# Patient Record
Sex: Female | Born: 1965 | Race: White | Hispanic: No | Marital: Married | Smoking: Never smoker
Health system: Southern US, Community
[De-identification: ages and names within clinical notes are randomized; demographics above are authoritative.]

## PROBLEM LIST (undated history)

## (undated) DIAGNOSIS — M6530 Trigger finger, unspecified finger: Secondary | ICD-10-CM

## (undated) DIAGNOSIS — F329 Major depressive disorder, single episode, unspecified: Secondary | ICD-10-CM

## (undated) DIAGNOSIS — J45909 Unspecified asthma, uncomplicated: Secondary | ICD-10-CM

## (undated) DIAGNOSIS — F419 Anxiety disorder, unspecified: Secondary | ICD-10-CM

## (undated) DIAGNOSIS — F32A Depression, unspecified: Secondary | ICD-10-CM

## (undated) HISTORY — PX: NO PAST SURGERIES: SHX2092

## (undated) HISTORY — DX: Unspecified asthma, uncomplicated: J45.909

---

## 2000-02-28 ENCOUNTER — Other Ambulatory Visit: Admission: RE | Admit: 2000-02-28 | Discharge: 2000-02-28 | Payer: Self-pay | Admitting: Obstetrics and Gynecology

## 2001-05-07 ENCOUNTER — Encounter: Admission: RE | Admit: 2001-05-07 | Discharge: 2001-05-07 | Payer: Self-pay | Admitting: Obstetrics and Gynecology

## 2001-05-07 ENCOUNTER — Encounter: Payer: Self-pay | Admitting: Obstetrics and Gynecology

## 2004-05-30 ENCOUNTER — Encounter: Admission: RE | Admit: 2004-05-30 | Discharge: 2004-05-30 | Payer: Self-pay | Admitting: Obstetrics and Gynecology

## 2004-07-02 ENCOUNTER — Other Ambulatory Visit: Admission: RE | Admit: 2004-07-02 | Discharge: 2004-07-02 | Payer: Self-pay | Admitting: Obstetrics and Gynecology

## 2005-07-03 ENCOUNTER — Other Ambulatory Visit: Admission: RE | Admit: 2005-07-03 | Discharge: 2005-07-03 | Payer: Self-pay | Admitting: Obstetrics and Gynecology

## 2005-08-14 ENCOUNTER — Encounter: Admission: RE | Admit: 2005-08-14 | Discharge: 2005-08-14 | Payer: Self-pay | Admitting: Obstetrics and Gynecology

## 2006-07-17 ENCOUNTER — Other Ambulatory Visit: Admission: RE | Admit: 2006-07-17 | Discharge: 2006-07-17 | Payer: Self-pay | Admitting: Obstetrics and Gynecology

## 2006-09-22 ENCOUNTER — Encounter: Admission: RE | Admit: 2006-09-22 | Discharge: 2006-09-22 | Payer: Self-pay | Admitting: Obstetrics and Gynecology

## 2007-01-19 ENCOUNTER — Ambulatory Visit (HOSPITAL_COMMUNITY): Admission: RE | Admit: 2007-01-19 | Discharge: 2007-01-19 | Payer: Self-pay | Admitting: Obstetrics and Gynecology

## 2007-07-20 ENCOUNTER — Other Ambulatory Visit: Admission: RE | Admit: 2007-07-20 | Discharge: 2007-07-20 | Payer: Self-pay | Admitting: Obstetrics and Gynecology

## 2007-09-24 ENCOUNTER — Encounter: Admission: RE | Admit: 2007-09-24 | Discharge: 2007-09-24 | Payer: Self-pay | Admitting: Obstetrics and Gynecology

## 2008-07-24 ENCOUNTER — Other Ambulatory Visit: Admission: RE | Admit: 2008-07-24 | Discharge: 2008-07-24 | Payer: Self-pay | Admitting: Obstetrics and Gynecology

## 2008-10-02 ENCOUNTER — Encounter: Admission: RE | Admit: 2008-10-02 | Discharge: 2008-10-02 | Payer: Self-pay | Admitting: Obstetrics and Gynecology

## 2009-06-12 ENCOUNTER — Ambulatory Visit: Payer: Self-pay | Admitting: Cardiology

## 2009-07-20 ENCOUNTER — Encounter: Admission: RE | Admit: 2009-07-20 | Discharge: 2009-07-20 | Payer: Self-pay | Admitting: Obstetrics and Gynecology

## 2009-08-09 ENCOUNTER — Other Ambulatory Visit: Admission: RE | Admit: 2009-08-09 | Discharge: 2009-08-09 | Payer: Self-pay | Admitting: Obstetrics and Gynecology

## 2010-08-22 ENCOUNTER — Other Ambulatory Visit: Admission: RE | Admit: 2010-08-22 | Discharge: 2010-08-22 | Payer: Self-pay | Admitting: Obstetrics and Gynecology

## 2010-08-22 ENCOUNTER — Other Ambulatory Visit
Admission: RE | Admit: 2010-08-22 | Discharge: 2010-08-22 | Payer: Self-pay | Source: Home / Self Care | Admitting: Obstetrics and Gynecology

## 2010-10-10 ENCOUNTER — Encounter: Admission: RE | Admit: 2010-10-10 | Discharge: 2010-10-10 | Payer: Self-pay | Admitting: Obstetrics and Gynecology

## 2011-09-04 ENCOUNTER — Other Ambulatory Visit: Payer: Self-pay | Admitting: Nurse Practitioner

## 2011-09-04 ENCOUNTER — Other Ambulatory Visit (HOSPITAL_COMMUNITY)
Admission: RE | Admit: 2011-09-04 | Discharge: 2011-09-04 | Disposition: A | Discharge status: Home / Self Care | Payer: 59 | Source: Ambulatory Visit | Attending: Obstetrics and Gynecology | Admitting: Obstetrics and Gynecology

## 2011-09-04 DIAGNOSIS — Z1159 Encounter for screening for other viral diseases: Secondary | ICD-10-CM | POA: Insufficient documentation

## 2011-09-04 DIAGNOSIS — Z01419 Encounter for gynecological examination (general) (routine) without abnormal findings: Secondary | ICD-10-CM | POA: Insufficient documentation

## 2011-10-14 ENCOUNTER — Other Ambulatory Visit: Payer: Self-pay | Admitting: Obstetrics and Gynecology

## 2011-10-14 DIAGNOSIS — Z1231 Encounter for screening mammogram for malignant neoplasm of breast: Secondary | ICD-10-CM

## 2011-11-10 ENCOUNTER — Ambulatory Visit: Payer: 59

## 2012-02-12 ENCOUNTER — Ambulatory Visit
Admission: RE | Admit: 2012-02-12 | Discharge: 2012-02-12 | Disposition: A | Discharge status: Home / Self Care | Payer: 59 | Source: Ambulatory Visit | Attending: Obstetrics and Gynecology | Admitting: Obstetrics and Gynecology

## 2012-02-12 DIAGNOSIS — Z1231 Encounter for screening mammogram for malignant neoplasm of breast: Secondary | ICD-10-CM

## 2013-05-30 ENCOUNTER — Other Ambulatory Visit: Payer: Self-pay

## 2013-05-30 DIAGNOSIS — Z1231 Encounter for screening mammogram for malignant neoplasm of breast: Secondary | ICD-10-CM

## 2013-06-16 ENCOUNTER — Ambulatory Visit
Admission: RE | Admit: 2013-06-16 | Discharge: 2013-06-16 | Disposition: A | Discharge status: Home / Self Care | Payer: BC Managed Care – PPO | Source: Ambulatory Visit

## 2013-06-16 DIAGNOSIS — Z1231 Encounter for screening mammogram for malignant neoplasm of breast: Secondary | ICD-10-CM

## 2013-09-08 ENCOUNTER — Other Ambulatory Visit (HOSPITAL_COMMUNITY)
Admission: RE | Admit: 2013-09-08 | Discharge: 2013-09-08 | Disposition: A | Discharge status: Home / Self Care | Payer: BC Managed Care – PPO | Source: Ambulatory Visit | Attending: Nurse Practitioner | Admitting: Nurse Practitioner

## 2013-09-08 ENCOUNTER — Other Ambulatory Visit: Payer: Self-pay | Admitting: Nurse Practitioner

## 2013-09-08 DIAGNOSIS — Z01419 Encounter for gynecological examination (general) (routine) without abnormal findings: Secondary | ICD-10-CM | POA: Insufficient documentation

## 2013-09-08 DIAGNOSIS — Z1151 Encounter for screening for human papillomavirus (HPV): Secondary | ICD-10-CM | POA: Insufficient documentation

## 2014-07-11 ENCOUNTER — Other Ambulatory Visit: Payer: Self-pay

## 2014-07-11 DIAGNOSIS — Z1231 Encounter for screening mammogram for malignant neoplasm of breast: Secondary | ICD-10-CM

## 2014-07-27 ENCOUNTER — Ambulatory Visit
Admission: RE | Admit: 2014-07-27 | Discharge: 2014-07-27 | Disposition: A | Discharge status: Home / Self Care | Payer: BC Managed Care – PPO | Source: Ambulatory Visit

## 2014-07-27 DIAGNOSIS — Z1231 Encounter for screening mammogram for malignant neoplasm of breast: Secondary | ICD-10-CM

## 2015-08-21 ENCOUNTER — Other Ambulatory Visit: Payer: Self-pay

## 2015-08-21 DIAGNOSIS — Z1231 Encounter for screening mammogram for malignant neoplasm of breast: Secondary | ICD-10-CM

## 2015-09-14 ENCOUNTER — Ambulatory Visit
Admission: RE | Admit: 2015-09-14 | Discharge: 2015-09-14 | Disposition: A | Discharge status: Home / Self Care | Payer: No Typology Code available for payment source | Source: Ambulatory Visit

## 2015-09-14 DIAGNOSIS — Z1231 Encounter for screening mammogram for malignant neoplasm of breast: Secondary | ICD-10-CM

## 2016-01-15 DIAGNOSIS — M65311 Trigger thumb, right thumb: Secondary | ICD-10-CM | POA: Insufficient documentation

## 2016-04-08 ENCOUNTER — Encounter (HOSPITAL_BASED_OUTPATIENT_CLINIC_OR_DEPARTMENT_OTHER): Payer: Self-pay | Admitting: *Deleted

## 2016-04-09 ENCOUNTER — Other Ambulatory Visit: Payer: Self-pay | Admitting: Orthopedic Surgery

## 2016-04-14 ENCOUNTER — Encounter (HOSPITAL_BASED_OUTPATIENT_CLINIC_OR_DEPARTMENT_OTHER)
Admission: RE | Disposition: A | Discharge status: Home / Self Care | Payer: Self-pay | Source: Ambulatory Visit | Attending: Orthopedic Surgery

## 2016-04-14 ENCOUNTER — Ambulatory Visit (HOSPITAL_BASED_OUTPATIENT_CLINIC_OR_DEPARTMENT_OTHER)
Admission: RE | Admit: 2016-04-14 | Discharge: 2016-04-14 | Disposition: A | Discharge status: Home / Self Care | Payer: Self-pay | Source: Ambulatory Visit | Attending: Orthopedic Surgery | Admitting: Orthopedic Surgery

## 2016-04-14 ENCOUNTER — Encounter (HOSPITAL_BASED_OUTPATIENT_CLINIC_OR_DEPARTMENT_OTHER): Payer: Self-pay | Admitting: *Deleted

## 2016-04-14 ENCOUNTER — Ambulatory Visit (HOSPITAL_BASED_OUTPATIENT_CLINIC_OR_DEPARTMENT_OTHER): Payer: Self-pay | Admitting: Anesthesiology

## 2016-04-14 DIAGNOSIS — F419 Anxiety disorder, unspecified: Secondary | ICD-10-CM | POA: Insufficient documentation

## 2016-04-14 DIAGNOSIS — M65311 Trigger thumb, right thumb: Secondary | ICD-10-CM | POA: Insufficient documentation

## 2016-04-14 HISTORY — DX: Trigger finger, unspecified finger: M65.30

## 2016-04-14 HISTORY — PX: TRIGGER FINGER RELEASE: SHX641

## 2016-04-14 HISTORY — DX: Depression, unspecified: F32.A

## 2016-04-14 HISTORY — DX: Anxiety disorder, unspecified: F41.9

## 2016-04-14 HISTORY — DX: Major depressive disorder, single episode, unspecified: F32.9

## 2016-04-14 SURGERY — RELEASE, A1 PULLEY, FOR TRIGGER FINGER
Anesthesia: Monitor Anesthesia Care | Site: Thumb | Laterality: Right

## 2016-04-14 MED ORDER — PROPOFOL 500 MG/50ML IV EMUL
INTRAVENOUS | Status: DC | PRN
  Administered 2016-04-14: 75 ug/kg/min via INTRAVENOUS

## 2016-04-14 MED ORDER — METOCLOPRAMIDE HCL 5 MG/ML IJ SOLN: 10.0000 mg | Freq: Once | INTRAMUSCULAR | Status: DC | PRN

## 2016-04-14 MED ORDER — FENTANYL CITRATE (PF) 100 MCG/2ML IJ SOLN
50.0000 ug | INTRAMUSCULAR | Status: DC | PRN
  Administered 2016-04-14: 50 ug via INTRAVENOUS
  Administered 2016-04-14: 25 ug via INTRAVENOUS

## 2016-04-14 MED ORDER — CEFAZOLIN SODIUM-DEXTROSE 2-4 GM/100ML-% IV SOLN
INTRAVENOUS | Status: AC
  Filled 2016-04-14: qty 100

## 2016-04-14 MED ORDER — LACTATED RINGERS IV SOLN
INTRAVENOUS | Status: DC
  Administered 2016-04-14: 13:00:00 via INTRAVENOUS

## 2016-04-14 MED ORDER — ONDANSETRON HCL 4 MG/2ML IJ SOLN
INTRAMUSCULAR | Status: DC | PRN
  Administered 2016-04-14: 4 mg via INTRAVENOUS

## 2016-04-14 MED ORDER — BUPIVACAINE HCL (PF) 0.25 % IJ SOLN
INTRAMUSCULAR | Status: DC | PRN
  Administered 2016-04-14: 5 mL

## 2016-04-14 MED ORDER — GLYCOPYRROLATE 0.2 MG/ML IJ SOLN
0.2000 mg | Freq: Once | INTRAMUSCULAR | Status: DC | PRN
Start: 2016-04-14 — End: 2016-04-14

## 2016-04-14 MED ORDER — MIDAZOLAM HCL 2 MG/2ML IJ SOLN
INTRAMUSCULAR | Status: AC
  Filled 2016-04-14: qty 2

## 2016-04-14 MED ORDER — MEPERIDINE HCL 25 MG/ML IJ SOLN: 6.2500 mg | INTRAMUSCULAR | Status: DC | PRN

## 2016-04-14 MED ORDER — FENTANYL CITRATE (PF) 100 MCG/2ML IJ SOLN
INTRAMUSCULAR | Status: AC
  Filled 2016-04-14: qty 2

## 2016-04-14 MED ORDER — PROPOFOL 500 MG/50ML IV EMUL
INTRAVENOUS | Status: AC
  Filled 2016-04-14: qty 50

## 2016-04-14 MED ORDER — SCOPOLAMINE 1 MG/3DAYS TD PT72: 1.0000 | MEDICATED_PATCH | Freq: Once | TRANSDERMAL | Status: DC | PRN

## 2016-04-14 MED ORDER — FENTANYL CITRATE (PF) 100 MCG/2ML IJ SOLN: 25.0000 ug | INTRAMUSCULAR | Status: DC | PRN

## 2016-04-14 MED ORDER — CEFAZOLIN SODIUM-DEXTROSE 2-4 GM/100ML-% IV SOLN
2.0000 g | INTRAVENOUS | Status: AC
  Administered 2016-04-14: 2 g via INTRAVENOUS

## 2016-04-14 MED ORDER — LACTATED RINGERS IV SOLN: INTRAVENOUS | Status: DC

## 2016-04-14 MED ORDER — CHLORHEXIDINE GLUCONATE 4 % EX LIQD: 60.0000 mL | Freq: Once | CUTANEOUS | Status: DC

## 2016-04-14 MED ORDER — ONDANSETRON HCL 4 MG/2ML IJ SOLN
INTRAMUSCULAR | Status: AC
  Filled 2016-04-14: qty 2

## 2016-04-14 MED ORDER — BUPIVACAINE HCL (PF) 0.25 % IJ SOLN
INTRAMUSCULAR | Status: AC
  Filled 2016-04-14: qty 30

## 2016-04-14 MED ORDER — MIDAZOLAM HCL 2 MG/2ML IJ SOLN
1.0000 mg | INTRAMUSCULAR | Status: DC | PRN
  Administered 2016-04-14: 0.5 mg via INTRAVENOUS
  Administered 2016-04-14: 1 mg via INTRAVENOUS

## 2016-04-14 MED ORDER — HYDROCODONE-ACETAMINOPHEN 5-325 MG PO TABS: ORAL_TABLET | ORAL | Status: DC

## 2016-04-14 SURGICAL SUPPLY — 36 items
BANDAGE COBAN STERILE 2 (GAUZE/BANDAGES/DRESSINGS) IMPLANT
BLADE MINI RND TIP GREEN BEAV (BLADE) IMPLANT
BLADE SURG 15 STRL LF DISP TIS (BLADE) ×2 IMPLANT
BLADE SURG 15 STRL SS (BLADE) ×6
BNDG CMPR 9X4 STRL LF SNTH (GAUZE/BANDAGES/DRESSINGS)
BNDG COHESIVE 3X5 TAN STRL LF (GAUZE/BANDAGES/DRESSINGS) ×3 IMPLANT
BNDG CONFORM 2 STRL LF (GAUZE/BANDAGES/DRESSINGS) ×3 IMPLANT
BNDG ESMARK 4X9 LF (GAUZE/BANDAGES/DRESSINGS) IMPLANT
CHLORAPREP W/TINT 26ML (MISCELLANEOUS) ×3 IMPLANT
CORDS BIPOLAR (ELECTRODE) ×3 IMPLANT
COVER BACK TABLE 60X90IN (DRAPES) ×3 IMPLANT
COVER MAYO STAND STRL (DRAPES) ×3 IMPLANT
CUFF TOURNIQUET SINGLE 18IN (TOURNIQUET CUFF) ×3 IMPLANT
DRAPE EXTREMITY T 121X128X90 (DRAPE) ×3 IMPLANT
DRAPE SURG 17X23 STRL (DRAPES) ×3 IMPLANT
DRESSING ADAPTIC 1/2  N-ADH (PACKING) ×3 IMPLANT
GAUZE SPONGE 4X4 12PLY STRL (GAUZE/BANDAGES/DRESSINGS) ×3 IMPLANT
GAUZE XEROFORM 1X8 LF (GAUZE/BANDAGES/DRESSINGS) ×3 IMPLANT
GLOVE BIO SURGEON STRL SZ7.5 (GLOVE) ×3 IMPLANT
GLOVE BIOGEL PI IND STRL 8 (GLOVE) ×1 IMPLANT
GLOVE BIOGEL PI INDICATOR 8 (GLOVE) ×2
GOWN STRL REIN XL XLG (GOWN DISPOSABLE) ×3 IMPLANT
GOWN STRL REUS W/ TWL LRG LVL3 (GOWN DISPOSABLE) ×1 IMPLANT
GOWN STRL REUS W/TWL LRG LVL3 (GOWN DISPOSABLE) ×2
NEEDLE HYPO 25X1 1.5 SAFETY (NEEDLE) IMPLANT
NS IRRIG 1000ML POUR BTL (IV SOLUTION) ×3 IMPLANT
PACK BASIN DAY SURGERY FS (CUSTOM PROCEDURE TRAY) ×3 IMPLANT
PADDING CAST ABS 4INX4YD NS (CAST SUPPLIES) ×2
PADDING CAST ABS COTTON 4X4 ST (CAST SUPPLIES) ×1 IMPLANT
SPONGE GAUZE 4X4 12PLY STER LF (GAUZE/BANDAGES/DRESSINGS) ×3 IMPLANT
STOCKINETTE 4X48 STRL (DRAPES) ×3 IMPLANT
SUT ETHILON 4 0 PS 2 18 (SUTURE) ×3 IMPLANT
SYR BULB 3OZ (MISCELLANEOUS) ×3 IMPLANT
SYR CONTROL 10ML LL (SYRINGE) IMPLANT
TOWEL OR 17X24 6PK STRL BLUE (TOWEL DISPOSABLE) ×3 IMPLANT
UNDERPAD 30X30 (UNDERPADS AND DIAPERS) ×3 IMPLANT

## 2016-04-14 NOTE — Anesthesia Postprocedure Evaluation (Signed)
Anesthesia Post Note  Patient: Alexa Cohen  Procedure(s) Performed: Procedure(s) (LRB): RIGHT THUMB TRIGGER RELEASE  (Right)  Patient location during evaluation: PACU Anesthesia Type: Bier Block Level of consciousness: awake and alert Pain management: pain level controlled Vital Signs Assessment: post-procedure vital signs reviewed and stable Respiratory status: spontaneous breathing, nonlabored ventilation, respiratory function stable and patient connected to nasal cannula oxygen Cardiovascular status: blood pressure returned to baseline and stable Postop Assessment: no signs of nausea or vomiting Anesthetic complications: no    Last Vitals:  Filed Vitals:   04/14/16 1445 04/14/16 1515  BP: 106/76 109/75  Pulse: 55 56  Temp:  36.4 C  Resp: 13 18    Last Pain:  Filed Vitals:   04/14/16 1516  PainSc: 0-No pain                 Phillips Groutarignan, [name]

## 2016-04-14 NOTE — Discharge Instructions (Addendum)

## 2016-04-14 NOTE — H&P (Signed)
  Alexa Cohen is an 50 y.o. female.   Chief Complaint: right thumb trigger digit HPI: 50 yo female with triggering of right thumb.  This has been injected twice without resolution.  She wishes to have a trigger release for management of her symptoms.  Allergies: No Known Allergies  Past Medical History  Diagnosis Date  . Depression   . Anxiety   . Trigger finger of right hand     thumb    Past Surgical History  Procedure Laterality Date  . No past surgeries      Family History: History reviewed. No pertinent family history.  Social History:   reports that she has never smoked. She does not have any smokeless tobacco history on file. She reports that she drinks alcohol. She reports that she does not use illicit drugs.  Medications: Medications Prior to Admission  Medication Sig Dispense Refill  . citalopram (CELEXA) 10 MG tablet Take 30 mg by mouth daily.      No results found for this or any previous visit (from the past 48 hour(s)).  No results found.   A comprehensive review of systems was negative.  Blood pressure 106/66, pulse 59, temperature 98 F (36.7 C), temperature source Oral, resp. rate 18, height 5\' 3"  (1.6 m), weight 66.86 kg (147 lb 6.4 oz), last menstrual period 04/08/2016, SpO2 98 %.  General appearance: alert, cooperative and appears stated age Head: Normocephalic, without obvious abnormality, atraumatic Neck: supple, symmetrical, trachea midline Resp: clear to auscultation bilaterally Cardio: regular rate and rhythm GI: non-tender Extremities: Intact sensation and capillary refill all digits.  +epl/fpl/io.  No wounds.  Pulses: 2+ and symmetric Skin: Skin color, texture, turgor normal. No rashes or lesions Neurologic: Grossly normal Incision/Wound:none  Assessment/Plan Right thumb trigger digit.  Non operative and operative treatment options were discussed with the patient and patient wishes to proceed with operative treatment. Risks,  benefits, and alternatives of surgery were discussed and the patient agrees with the plan of care.   Orie Baxendale R 04/14/2016, 1:45 PM

## 2016-04-14 NOTE — Brief Op Note (Signed)
04/14/2016  2:19 PM  PATIENT:  Lakita P Vacha  50 y.o. female  PRE-OPERATIVE DIAGNOSIS:  RIGHT THUMB TRIGGER DIGIT   POST-OPERATIVE DIAGNOSIS:  RIGHT THUMB TRIGGER DIGIT   PROCEDURE:  Procedure(s) with comments: RIGHT THUMB TRIGGER RELEASE  (Right) - RIGHT THUMB TRIGGER RELEASE   SURGEON:  Surgeon(s) and Role:    * Betha LoaKevin Maddox Bratcher, MD - Primary  PHYSICIAN ASSISTANT:   ASSISTANTS: none   ANESTHESIA:   Bier block with sedation  EBL:     BLOOD ADMINISTERED:none  DRAINS: none   LOCAL MEDICATIONS USED:  MARCAINE     SPECIMEN:  No Specimen  DISPOSITION OF SPECIMEN:  N/A  COUNTS:  YES  TOURNIQUET:   Total Tourniquet Time Documented: Forearm (Right) - 18 minutes Total: Forearm (Right) - 18 minutes   DICTATION: .Other Dictation: Dictation Number [id number]  PLAN OF CARE: Discharge to home after PACU  PATIENT DISPOSITION:  PACU - hemodynamically stable.

## 2016-04-14 NOTE — Anesthesia Preprocedure Evaluation (Signed)
Anesthesia Evaluation  Patient identified by MRN, date of birth, ID band Patient awake    Reviewed: Allergy & Precautions, NPO status , Patient's Chart, lab work & pertinent test results  Airway Mallampati: II  TM Distance: >3 FB Neck ROM: Full    Dental no notable dental hx.    Pulmonary neg pulmonary ROS,    Pulmonary exam normal breath sounds clear to auscultation       Cardiovascular negative cardio ROS Normal cardiovascular exam Rhythm:Regular Rate:Normal     Neuro/Psych negative neurological ROS  negative psych ROS   GI/Hepatic negative GI ROS, Neg liver ROS,   Endo/Other  negative endocrine ROS  Renal/GU negative Renal ROS  negative genitourinary   Musculoskeletal negative musculoskeletal ROS (+)   Abdominal   Peds negative pediatric ROS (+)  Hematology negative hematology ROS (+)   Anesthesia Other Findings   Reproductive/Obstetrics negative OB ROS                             Anesthesia Physical Anesthesia Plan  ASA: II  Anesthesia Plan: Bier Block and MAC   Post-op Pain Management:    Induction: Intravenous  Airway Management Planned: Natural Airway  Additional Equipment:   Intra-op Plan:   Post-operative Plan:   Informed Consent: I have reviewed the patients History and Physical, chart, labs and discussed the procedure including the risks, benefits and alternatives for the proposed anesthesia with the patient or authorized representative who has indicated his/her understanding and acceptance.   Dental advisory given  Plan Discussed with: CRNA  Anesthesia Plan Comments:         Anesthesia Quick Evaluation

## 2016-04-14 NOTE — Transfer of Care (Signed)
Immediate Anesthesia Transfer of Care Note  Patient: Alexa Cohen  Procedure(s) Performed: Procedure(s) with comments: RIGHT THUMB TRIGGER RELEASE  (Right) - RIGHT THUMB TRIGGER RELEASE   Patient Location: PACU  Anesthesia Type:Bier block  Level of Consciousness: awake, alert  and oriented  Airway & Oxygen Therapy: Patient Spontanous Breathing and Patient connected to face mask oxygen  Post-op Assessment: Report given to RN and Post -op Vital signs reviewed and stable  Post vital signs: Reviewed and stable  Last Vitals:  Filed Vitals:   04/14/16 1445 04/14/16 1515  BP: 106/76 109/75  Pulse: 55 56  Temp:  36.4 C  Resp: 13 18    Last Pain:  Filed Vitals:   04/14/16 1516  PainSc: 0-No pain         Complications: No apparent anesthesia complications

## 2016-04-14 NOTE — Op Note (Signed)
[id number] 

## 2016-04-15 ENCOUNTER — Encounter (HOSPITAL_BASED_OUTPATIENT_CLINIC_OR_DEPARTMENT_OTHER): Payer: Self-pay | Admitting: Orthopedic Surgery

## 2016-04-15 NOTE — Op Note (Signed)
Alexa Cohen, Alexa Cohen               ACCOUNT NO.:  [id number]  MEDICAL RECORD NO.:  [id number]  LOCATION:                                 FACILITY:  PHYSICIAN:  Betha Loa, MD             DATE OF BIRTH:  DATE OF PROCEDURE:  04/14/2016 DATE OF DISCHARGE:                              OPERATIVE REPORT   PREOPERATIVE DIAGNOSIS:  Right thumb trigger digit.  POSTOPERATIVE DIAGNOSIS:  Right thumb trigger digit.  PROCEDURE:  Right thumb trigger release.  SURGEON:  Betha Loa, MD  ASSISTANT:  None.  ANESTHESIA:  Bier block with sedation.  IV FLUIDS:  Per anesthesia flow sheet.  ESTIMATED BLOOD LOSS:  Minimal.  COMPLICATIONS:  None.  SPECIMENS:  None.  TOURNIQUET TIME:  18 minutes.  DISPOSITION:  Stable to PACU.  INDICATIONS:  Alexa Cohen is a 50 year old female who has had triggering of the right thumb.  This has been injected twice without resolution. She wished to have a trigger release for management of the symptoms. Risks, benefits and alternatives of the surgery were discussed including the risk of blood loss; infection; damage to nerves, vessels, tendons, ligaments, bone; failure of surgery; need for additional surgery; complications with wound healing; continued pain and recurrence of triggering.  She voiced understanding of these risks and elected to proceed.  OPERATIVE COURSE:  After being identified preoperatively by myself, the patient and I agreed upon the procedure and site of procedure.  Surgical site was marked.  Risks, benefits, and alternatives of the surgery were reviewed and she wished to proceed.  Surgical consent had been signed. She was given IV Ancef as preoperative antibiotic prophylaxis.  She was transferred to the operating room and placed on the operating room table in supine position with the right upper extremity on an armboard.  Bier block anesthesia was induced by anesthesiologist.  The right upper extremity was prepped and draped in normal  sterile orthopedic fashion. A surgical pause was performed between the surgeons, anesthesia, and operating room staff, and all were in agreement as to the patient, procedure and site of procedure.  Tourniquet at the proximal aspect of the forearm had been inflated for the Bier block.  Incision was made at the proximal flexion crease of the thumb through the skin only.  This was carried into subcutaneous tissues by spreading technique.  The radial and ulnar digital nerves were identified and protected throughout the case.  The A1 pulley was identified and sharply incised.  This was incised with knife and scissors.  The tendon was brought through the wound and no triggering was noted.  The wound was irrigated with sterile saline.  It was closed with 4-0 nylon in a horizontal mattress fashion. It was injected with 5 mL of 0.25% plain Marcaine to aid in postoperative analgesia.  It was then dressed with sterile Xeroform, 4x4s, and wrapped with a Kling and Coban dressing lightly.  Tourniquet was deflated at 18 minutes.  The fingertips were pink with brisk capillary refill after the deflation of the tourniquet.  Operative drapes were broken down.  The patient was awoken from anesthesia safely. She was transferred back to  the stretcher and taken to PACU in stable condition.  I will see her back in the office in 1 week for postoperative followup.  I will give her Norco 5/325, 1-2 p.o. q.6 hours p.r.n. pain, dispensed #15.     Betha LoaKevin Diyan Dave, MD     KK/MEDQ  D:  04/14/2016  T:  04/15/2016  Job:  [id number]

## 2016-09-11 ENCOUNTER — Other Ambulatory Visit: Payer: Self-pay | Admitting: Nurse Practitioner

## 2016-09-11 ENCOUNTER — Other Ambulatory Visit (HOSPITAL_COMMUNITY)
Admission: RE | Admit: 2016-09-11 | Discharge: 2016-09-11 | Disposition: A | Discharge status: Home / Self Care | Payer: No Typology Code available for payment source | Source: Ambulatory Visit | Attending: Nurse Practitioner | Admitting: Nurse Practitioner

## 2016-09-11 DIAGNOSIS — Z01419 Encounter for gynecological examination (general) (routine) without abnormal findings: Secondary | ICD-10-CM | POA: Insufficient documentation

## 2016-09-11 DIAGNOSIS — Z1151 Encounter for screening for human papillomavirus (HPV): Secondary | ICD-10-CM | POA: Insufficient documentation

## 2016-09-15 LAB — CYTOLOGY - PAP
Diagnosis: NEGATIVE
HPV (WINDOPATH): NOT DETECTED

## 2016-09-19 ENCOUNTER — Other Ambulatory Visit: Payer: Self-pay | Admitting: Nurse Practitioner

## 2016-09-19 DIAGNOSIS — Z1231 Encounter for screening mammogram for malignant neoplasm of breast: Secondary | ICD-10-CM

## 2016-10-27 ENCOUNTER — Ambulatory Visit
Admission: RE | Admit: 2016-10-27 | Discharge: 2016-10-27 | Disposition: A | Discharge status: Home / Self Care | Payer: No Typology Code available for payment source | Source: Ambulatory Visit | Attending: Nurse Practitioner | Admitting: Nurse Practitioner

## 2016-10-27 DIAGNOSIS — Z1231 Encounter for screening mammogram for malignant neoplasm of breast: Secondary | ICD-10-CM

## 2017-12-17 ENCOUNTER — Other Ambulatory Visit: Payer: Self-pay | Admitting: Nurse Practitioner

## 2017-12-17 DIAGNOSIS — Z139 Encounter for screening, unspecified: Secondary | ICD-10-CM

## 2018-01-07 ENCOUNTER — Ambulatory Visit
Admission: RE | Admit: 2018-01-07 | Discharge: 2018-01-07 | Disposition: A | Discharge status: Home / Self Care | Payer: No Typology Code available for payment source | Source: Ambulatory Visit | Attending: Nurse Practitioner | Admitting: Nurse Practitioner

## 2018-01-07 DIAGNOSIS — Z139 Encounter for screening, unspecified: Secondary | ICD-10-CM

## 2018-10-14 ENCOUNTER — Ambulatory Visit: Payer: Self-pay | Admitting: Psychiatry

## 2018-10-20 ENCOUNTER — Encounter: Payer: Self-pay | Admitting: Emergency Medicine

## 2018-10-20 DIAGNOSIS — F419 Anxiety disorder, unspecified: Secondary | ICD-10-CM

## 2018-10-20 DIAGNOSIS — F331 Major depressive disorder, recurrent, moderate: Secondary | ICD-10-CM | POA: Insufficient documentation

## 2018-10-28 ENCOUNTER — Encounter: Payer: Self-pay | Admitting: Physician Assistant

## 2018-10-28 ENCOUNTER — Ambulatory Visit: Payer: Self-pay | Admitting: Physician Assistant

## 2018-10-28 DIAGNOSIS — F331 Major depressive disorder, recurrent, moderate: Secondary | ICD-10-CM

## 2018-10-28 DIAGNOSIS — F411 Generalized anxiety disorder: Secondary | ICD-10-CM

## 2018-10-28 MED ORDER — BUPROPION HCL ER (XL) 300 MG PO TB24: 300.0000 mg | ORAL_TABLET | ORAL | 1 refills | Status: DC

## 2018-10-28 MED ORDER — CITALOPRAM HYDROBROMIDE 20 MG PO TABS: 30.0000 mg | ORAL_TABLET | Freq: Every day | ORAL | 1 refills | Status: DC

## 2018-10-28 NOTE — Progress Notes (Signed)
[facility] Med Check  Patient ID: Tonny BollmanCarmela P Karpowicz,  MRN: [id number]  PCP: Estanislado PandySasser, Paul W, MD  Date of Evaluation: 10/28/2018 Time spent:15 minutes  Chief Complaint:  Chief Complaint    Follow-up      HISTORY/CURRENT STATUS: HPI Here for routine 6 month med check.  Patient denies loss of interest in usual activities and is able to enjoy things.  Denies decreased energy or motivation.  Appetite has not changed.  No extreme sadness, tearfulness, or feelings of hopelessness.  Denies any changes in concentration, making decisions or remembering things.  Denies suicidal or homicidal thoughts.  Anxiety is well controlled.  Sleeps well for the most part.   Individual Medical History/ Review of Systems: Changes? :No   Allergies: Patient has no known allergies.  Current Medications:  Current Outpatient Medications:  .  buPROPion (WELLBUTRIN XL) 300 MG 24 hr tablet, Take 300 mg by mouth every morning., Disp: , Rfl:  .  citalopram (CELEXA) 20 MG tablet, Take 30 mg by mouth daily., Disp: , Rfl:  .  LORazepam (ATIVAN) 0.5 MG tablet, Take 0.5 mg by mouth 3 (three) times daily as needed for anxiety., Disp: , Rfl:  Medication Side Effects: none  Family Medical/ Social History: Changes? No  MENTAL HEALTH EXAM:  There were no vitals taken for this visit.There is no height or weight on file to calculate BMI.  General Appearance: Casual and Well Groomed  Eye Contact:  Good  Speech:  Clear and Coherent  Volume:  Normal  Mood:  Euthymic  Affect:  Appropriate  Thought Process:  Goal Directed  Orientation:  Full (Time, Place, and Person)  Thought Content: Logical   Suicidal Thoughts:  No  Homicidal Thoughts:  No  Memory:  WNL  Judgement:  Good  Insight:  Good  Psychomotor Activity:  Normal  Concentration:  Concentration: Good  Recall:  Good  Fund of Knowledge: Good  Language: Good  Assets:  Desire for Improvement  ADL's:  Intact  Cognition: WNL  Prognosis:  Good     DIAGNOSES:    ICD-10-CM   1. Major depressive disorder, recurrent episode, moderate (HCC) F33.1   2. Generalized anxiety disorder F41.1     Receiving Psychotherapy: No was seeing Fred May, LPC but going prn now.  RECOMMENDATIONS: cont all current meds.  She asked about weaning off meds if possible.  Winter is her most difficult time of the year.  Therefore she will wait to decrease Celexa to 20 mg in the late spring, if she decides to do it.  She may not want to do that and either way is fine with me. If she needs Ativan refill before the next visit she can call. Continue therapy with Merlyn AlbertFred may LPC. Return in 6 months or sooner as needed.   Melony Overlyeresa Carmel Waddington, PA-C

## 2018-12-16 ENCOUNTER — Ambulatory Visit: Payer: Self-pay | Admitting: Psychiatry

## 2019-03-01 ENCOUNTER — Ambulatory Visit: Payer: Self-pay | Admitting: Psychiatry

## 2019-04-28 ENCOUNTER — Ambulatory Visit: Payer: Self-pay | Admitting: Physician Assistant

## 2019-06-06 ENCOUNTER — Other Ambulatory Visit: Payer: Self-pay | Admitting: Nurse Practitioner

## 2019-06-06 DIAGNOSIS — Z1231 Encounter for screening mammogram for malignant neoplasm of breast: Secondary | ICD-10-CM

## 2019-07-05 ENCOUNTER — Ambulatory Visit: Payer: Self-pay | Admitting: Physician Assistant

## 2019-07-05 ENCOUNTER — Other Ambulatory Visit: Payer: Self-pay

## 2019-07-05 ENCOUNTER — Telehealth: Payer: Self-pay | Admitting: Physician Assistant

## 2019-07-05 MED ORDER — CITALOPRAM HYDROBROMIDE 20 MG PO TABS: 30.0000 mg | ORAL_TABLET | Freq: Every day | ORAL | 0 refills | Status: DC

## 2019-07-05 MED ORDER — BUPROPION HCL ER (XL) 300 MG PO TB24: 300.0000 mg | ORAL_TABLET | ORAL | 0 refills | Status: DC

## 2019-07-05 NOTE — Telephone Encounter (Signed)
Refills sent

## 2019-07-05 NOTE — Telephone Encounter (Signed)
Pt request refill for Wellbutrin and Citalopram 90 day @ [facility] in [place] on file. Had appt today provider sick. We rescheduled.

## 2019-08-09 ENCOUNTER — Ambulatory Visit
Admission: RE | Admit: 2019-08-09 | Discharge: 2019-08-09 | Disposition: A | Discharge status: Home / Self Care | Payer: BC Managed Care – PPO | Source: Ambulatory Visit | Attending: Nurse Practitioner | Admitting: Nurse Practitioner

## 2019-08-09 ENCOUNTER — Other Ambulatory Visit: Payer: Self-pay

## 2019-08-09 ENCOUNTER — Encounter: Payer: Self-pay | Admitting: Psychiatry

## 2019-08-09 ENCOUNTER — Ambulatory Visit: Payer: Self-pay | Admitting: Physician Assistant

## 2019-08-09 ENCOUNTER — Ambulatory Visit (INDEPENDENT_AMBULATORY_CARE_PROVIDER_SITE_OTHER): Payer: BC Managed Care – PPO | Admitting: Psychiatry

## 2019-08-09 DIAGNOSIS — Z1231 Encounter for screening mammogram for malignant neoplasm of breast: Secondary | ICD-10-CM

## 2019-08-09 DIAGNOSIS — F4323 Adjustment disorder with mixed anxiety and depressed mood: Secondary | ICD-10-CM | POA: Diagnosis not present

## 2019-08-09 NOTE — Progress Notes (Signed)
      [facility] Counselor/Therapist Progress Note  Patient ID: Alexa Cohen, MRN: [id number],    Date: 08/09/2019  Time Spent: 50 minutes   Treatment Type: Individual Therapy  Reported Symptoms: anxious frustrated, irritable  Mental Status Exam:  Appearance:   Casual     Behavior:  Appropriate  Motor:  Normal  Speech/Language:   Clear and Coherent  Affect:  Appropriate  Mood:  anxious and irritable  Thought process:  normal  Thought content:    WNL  Sensory/Perceptual disturbances:    WNL  Orientation:  oriented to person, place, time/date and situation  Attention:  Good  Concentration:  Good  Memory:  WNL  Fund of knowledge:   Good  Insight:    Good  Judgment:   Good  Impulse Control:  Good   Risk Assessment: Danger to Self:  No Self-injurious Behavior: No Danger to Others: No Duty to Warn:no Physical Aggression / Violence:No  Access to Firearms a concern: No  Gang Involvement:No   Subjective: The client states that she has been having a lot of anxiety due to the current political climate and the COVID 19 pandemic.  She identified 3 problems.  1) with COVID and politics. Her negative cognition is, "I am powerless".  With frustration and irritation in her chest.  2) I have cabin fever.  Her negative cognition is, "I feel trapped."  She feels frustration in her chest.  3) My husband has been not finishing projects around the house.  Her negative cognition is, "I have no control ".  She feels frustration in her chest. I started with eye-movement around these issues.  Especially with the cognitions around powerlessness and lack of control.  As the client processed she realized that there was only so much she could do.  She stated, "I do not need to personalize peoples attacks on social media ".  As she continued to process this her subjective units of distress went from a 6+ to less than 2. She has decided to decrease her time on social media.  Also to be more proactive  and engage others in a positive way.  She will be more vocal about what she needs from her husband about completing projects.  Interventions: Assertiveness/Communication, Motivational Interviewing, Solution-Oriented/Positive Psychology, [employer] Desensitization and Reprocessing (EMDR) and Insight-Oriented   Diagnosis:   ICD-10-CM   1. Adjustment disorder with mixed anxiety and depressed mood  F43.23     Plan: Assertiveness, boundaries, proactiveness, self-care, decrease time on social media.  Atsushi Yom, [facility]

## 2019-11-09 ENCOUNTER — Other Ambulatory Visit: Payer: Self-pay | Admitting: Physician Assistant

## 2019-11-09 NOTE — Telephone Encounter (Signed)
Have patient schedule apt

## 2019-11-10 NOTE — Telephone Encounter (Signed)
Patient has an appointment on 2/04

## 2019-11-20 ENCOUNTER — Other Ambulatory Visit: Payer: Self-pay | Admitting: Physician Assistant

## 2019-12-14 ENCOUNTER — Other Ambulatory Visit: Payer: Self-pay

## 2019-12-14 ENCOUNTER — Ambulatory Visit: Payer: Self-pay | Admitting: Allergy & Immunology

## 2019-12-14 ENCOUNTER — Encounter: Payer: Self-pay | Admitting: Allergy & Immunology

## 2019-12-14 VITALS — BP 128/88 | HR 86 | Temp 98.4°F | Resp 18 | Ht 63.0 in | Wt 153.6 lb

## 2019-12-14 DIAGNOSIS — J3089 Other allergic rhinitis: Secondary | ICD-10-CM

## 2019-12-14 DIAGNOSIS — J302 Other seasonal allergic rhinitis: Secondary | ICD-10-CM

## 2019-12-14 DIAGNOSIS — T63481D Toxic effect of venom of other arthropod, accidental (unintentional), subsequent encounter: Secondary | ICD-10-CM

## 2019-12-14 MED ORDER — AZELASTINE HCL 0.1 % NA SOLN: 2.0000 | Freq: Two times a day (BID) | NASAL | 5 refills | Status: DC

## 2019-12-14 MED ORDER — MONTELUKAST SODIUM 10 MG PO TABS: 10.0000 mg | ORAL_TABLET | Freq: Every day | ORAL | 5 refills | Status: DC

## 2019-12-14 NOTE — Progress Notes (Addendum)
NEW PATIENT  Date of Service/Encounter:  12/14/19  Referring provider: Estanislado Pandy, MD   Assessment:   Seasonal and perennial allergic rhinitis (ragweed, weeds, trees, indoor molds, outdoor molds, dog and cockroach)  Stinging insect hypersensitivity - no testing performed  Plan/Recommendations:   1. Seasonal and perennial allergic rhinitis - Testing today showed: ragweed, weeds, trees, indoor molds, outdoor molds, dog and cockroach - Copy of test results provided.  - Avoidance measures provided. - Stop taking: Claritin (loratadine) - Continue with: Flonase (fluticasone) two sprays per nostril daily - Start taking: Zyrtec (cetirizine) 10mg  tablet once daily, Singulair (montelukast) 10mg  daily and Astelin (azelastine) 2 sprays per nostril 1-2 times daily as needed - You can use an extra dose of the antihistamine, if needed, for breakthrough symptoms.  - Consider nasal saline rinses 1-2 times daily to remove allergens from the nasal cavities as well as help with mucous clearance (this is especially helpful to do before the nasal sprays are given) - SEE RECIPE - Consider allergy shots as a means of long-term control. - Allergy shots "re-train" and "reset" the immune system to ignore environmental allergens and decrease the resulting immune response to those allergens (sneezing, itchy watery eyes, runny nose, nasal congestion, etc).    - Allergy shots improve symptoms in 75-85% of patients.  - We can discuss more at the next appointment if the medications are not working for you.  2. Return in about 3 months (around 03/12/2020). This can be an in-person, a virtual Webex or a telephone follow up visit.   Subjective:   Alexa Cohen is a 54 y.o. female presenting today for evaluation of  Chief Complaint  Patient presents with  . Nasal Congestion  . Sinus Problem    had 3+ sinus infection in 2020    Alexa Cohen has a history of the following: Patient Active Problem List     Diagnosis Date Noted  . Moderate recurrent major depression (HCC) 10/20/2018  . Anxiety 10/20/2018    History obtained from: chart review and patient.  Alexa Cohen was referred by 14/09/2018, MD.     Alexa Cohen is a 54 y.o. female presenting for an evaluation of environmental allergies.  She has a longstanding history of environmental allergy symptoms centered mostly in sinus congestion.  She has some ocular symptoms and sneezing as well, but she is certainly most bothered by the chronic congestion.  She estimates that she gets sinus infections 3-4 times per year.  This year has been particularly bad.  She has been on loratadine and Flonase every day throughout the entire year for almost 2 or 3 years.  She continues to get the sinus infections despite all of this.  She has been on Nasacort as well.  She has never been on Astelin or Patanase.  She has never been allergy tested.  There are no environments that seem to make her symptoms worse.  She does typically get prednisone with age of these antibiotic courses as well.  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  She does have an EpiPen.  She has never been tested for venom allergies.   She does see a dermatologist once a year for mole checks.  She has no history of eczema or urticaria.  Otherwise, there is no history of other atopic diseases, including food allergies, drug allergies, eczema, urticaria or contact dermatitis. There is no significant infectious history. Vaccinations are up to date.  Past Medical History: Patient Active Problem List   Diagnosis Date Noted  . Moderate recurrent major depression (HCC) 10/20/2018  . Anxiety 10/20/2018    Medication List:  Allergies as of 12/14/2019   No Known Allergies     Medication List       Accurate as of December 14, 2019 10:52 PM. If you have any questions, ask your nurse or doctor.        acetaminophen 500 MG tablet Commonly known as:  TYLENOL Take 500 mg by mouth every 6 (six) hours as needed.   azelastine 0.1 % nasal spray Commonly known as: ASTELIN Place 2 sprays into both nostrils 2 (two) times daily. Started by: Alfonse Spruce, MD   buPROPion 300 MG 24 hr tablet Commonly known as: WELLBUTRIN XL TAKE 1 TABLET BY MOUTH ONCE DAILY IN THE MORNING   citalopram 20 MG tablet Commonly known as: CELEXA TAKE 1 & 1/2 (ONE & ONE-HALF) TABLETS BY MOUTH ONCE DAILY What changed: See the new instructions.   dextromethorphan 30 MG/5ML liquid Commonly known as: DELSYM Take 30 mg by mouth as needed for cough.   fluticasone 50 MCG/ACT nasal spray Commonly known as: FLONASE Place 1 spray into both nostrils daily as needed for allergies or rhinitis.   guaiFENesin 600 MG 12 hr tablet Commonly known as: MUCINEX Take 1,200 mg by mouth 2 (two) times daily as needed.   ibuprofen 200 MG tablet Commonly known as: ADVIL Take 200 mg by mouth every 6 (six) hours as needed.   loratadine 10 MG tablet Commonly known as: CLARITIN Take 10 mg by mouth daily as needed for allergies.   LORazepam 0.5 MG tablet Commonly known as: ATIVAN Take 0.5 mg by mouth 3 (three) times daily as needed for anxiety.   montelukast 10 MG tablet Commonly known as: SINGULAIR Take 1 tablet (10 mg total) by mouth at bedtime. Started by: Alfonse Spruce, MD   phenylephrine 0.25 % nasal spray Commonly known as: NEO-SYNEPHRINE Place 1 spray into both nostrils every 6 (six) hours as needed for congestion.       Birth History: non-contributory  Developmental History: non-contributory  Past Surgical History: Past Surgical History:  Procedure Laterality Date  . NO PAST SURGERIES    . TRIGGER FINGER RELEASE Right 04/14/2016   Procedure: RIGHT THUMB TRIGGER RELEASE ;  Surgeon: Betha Loa, MD;  Location: [facility];  Service: Orthopedics;  Laterality: Right;  RIGHT THUMB TRIGGER RELEASE      Family History: Family History   Problem Relation Age of Onset  . Breast cancer Mother 21     Social History: Alexa Cohen lives at home with her husband.  They live in a house that is 28 years old.  There is wood in the bedrooms.  She has electric heating and central cooling.  There are 5 dogs in total and all of them stay in the house.  One of them sleeps with them in their bedroom.  She does not have dust mite covers on her bedding.  There are no roaches.  There is no tobacco exposure.  She is partially retired.  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 Her husband evidently is spending his time retired from [employer] by building an adult tree house. It is three levels tall.   Review of Systems  Constitutional: Negative.  Negative for chills, fever, malaise/fatigue and weight loss.  HENT: Positive for congestion, sinus  pain and sore throat. Negative for ear discharge and ear pain.   Eyes: Negative for pain, discharge and redness.  Respiratory: Negative for cough, sputum production, shortness of breath and wheezing.   Cardiovascular: Negative.  Negative for chest pain and palpitations.  Gastrointestinal: Negative for abdominal pain, constipation, diarrhea, heartburn, nausea and vomiting.  Skin: Negative.  Negative for itching and rash.  Neurological: Negative for dizziness and headaches.  Endo/Heme/Allergies: Positive for environmental allergies. Does not bruise/bleed easily.       Objective:   Blood pressure 128/88, pulse 86, temperature 98.4 F (36.9 C), temperature source Temporal, resp. rate 18, height 5\' 3"  (1.6 m), weight 153 lb 9.6 oz (69.7 kg), last menstrual period 10/06/2016, SpO2 95 %. Body mass index is 27.21 kg/m.   Physical Exam:   Physical Exam  Constitutional: She appears well-developed.  Pleasant female.  Very talkative.  HENT:  Head: Normocephalic and atraumatic.  Right Ear: Tympanic membrane, external ear and ear canal normal. No drainage,  swelling or tenderness. Tympanic membrane is not injected, not scarred, not erythematous, not retracted and not bulging.  Left Ear: Tympanic membrane, external ear and ear canal normal. No drainage, swelling or tenderness. Tympanic membrane is not injected, not scarred, not erythematous, not retracted and not bulging.  Nose: Mucosal edema and rhinorrhea present. No nasal deformity or septal deviation. No epistaxis. Right sinus exhibits no maxillary sinus tenderness and no frontal sinus tenderness. Left sinus exhibits no maxillary sinus tenderness and no frontal sinus tenderness.  Mouth/Throat: Uvula is midline and oropharynx is clear and moist. Mucous membranes are not pale and not dry.  There is some cobblestoning of the posterior oropharynx.  Eyes: Pupils are equal, round, and reactive to light. Conjunctivae and EOM are normal. Right eye exhibits no chemosis and no discharge. Left eye exhibits no chemosis and no discharge. Right conjunctiva is not injected. Left conjunctiva is not injected.  Allergic shiners bilaterally.  Cardiovascular: Normal rate, regular rhythm and normal heart sounds.  Respiratory: Effort normal and breath sounds normal. No accessory muscle usage. No tachypnea. No respiratory distress. She has no wheezes. She has no rhonchi. She has no rales. She exhibits no tenderness.  Moving air well in all lung fields.  GI: There is no abdominal tenderness. There is no rebound and no guarding.  Lymphadenopathy:       Head (right side): No submandibular, no tonsillar and no occipital adenopathy present.       Head (left side): No submandibular, no tonsillar and no occipital adenopathy present.    She has no cervical adenopathy.  Neurological: She is alert.  Skin: No abrasion, no petechiae and no rash noted. Rash is not papular, not vesicular and not urticarial. No erythema. No pallor.  Psychiatric: She has a normal mood and affect.     Diagnostic studies:   Allergy Studies:     Airborne Adult Perc - 12/14/19 0954    Time Antigen Placed  0954    Allergen Manufacturer  Lavella Hammock    Location  Back    Number of Test  59    Panel 1  Select    1. Control-Buffer 50% Glycerol  Negative    2. Control-Histamine 1 mg/ml  2+    3. Albumin saline  Negative    4. Algood  Negative    5. [place]  Negative    6. Johnson  Negative    7. [place] Blue  Negative    8. Meadow Fescue  Negative  9. Perennial Rye  Negative    10. Sweet Vernal  Negative    11. Timothy  Negative    12. Cocklebur  Negative    13. Burweed Marshelder  Negative    14. Ragweed, short  2+    15. Ragweed, Giant  2+    16. Plantain,  English  2+    17. Lamb's Quarters  2+    18. Sheep Sorrell  2+    19. Rough Pigweed  Negative    20. Marsh Elder, Rough  Negative    21. Mugwort, Common  Negative    22. Ash mix  Negative    23. Birch mix  Negative    24. Beech American  Negative    25. Box, Elder  2+    26. Cedar, red  2+    27. Cottonwood, [place]  Negative    28. Elm mix  Negative    29. Hickory mix  2+    30. Maple mix  Negative    31. Oak, [place] mix  2+    32. Pecan Pollen  2+    33. Pine mix  Negative    34. Sycamore Eastern  2+    35. Walnut, Black Pollen  2+    36. Alternaria alternata  2+    37. Cladosporium Herbarum  2+    38. Aspergillus mix  2+    39. Penicillium mix  2+    40. Bipolaris sorokiniana (Helminthosporium)  Negative    41. Drechslera spicifera (Curvularia)  2+    42. Mucor plumbeus  Negative    43. Fusarium moniliforme  Negative    44. Aureobasidium pullulans (pullulara)  Negative    45. Rhizopus oryzae  Negative    46. Botrytis cinera  2+    47. Epicoccum nigrum  2+    48. Phoma betae  Negative    49. Candida Albicans  Negative    50. Trichophyton mentagrophytes  Negative    51. Mite, D Farinae  5,000 AU/ml  Negative    52. Mite, D Pteronyssinus  5,000 AU/ml  Negative    53. Cat Hair 10,000 BAU/ml  Negative    54.  Dog Epithelia  3+    55. Mixed Feathers   2+    56. Horse Epithelia  2+    57. Cockroach, German  3+    58. Mouse  2+    59. Tobacco Leaf  Negative       Allergy testing results were read and interpreted by myself, documented by clinical staff.         Malachi Bonds, MD [facility]

## 2019-12-14 NOTE — Patient Instructions (Addendum)
1. Seasonal and perennial allergic rhinitis - Testing today showed: ragweed, weeds, trees, indoor molds, outdoor molds, dog and cockroach - Copy of test results provided.  - Avoidance measures provided. - Stop taking: Claritin (loratadine) - Continue with: Flonase (fluticasone) two sprays per nostril daily - Start taking: Zyrtec (cetirizine) 10mg  tablet once daily, Singulair (montelukast) 10mg  daily and Astelin (azelastine) 2 sprays per nostril 1-2 times daily as needed - You can use an extra dose of the antihistamine, if needed, for breakthrough symptoms.  - Consider nasal saline rinses 1-2 times daily to remove allergens from the nasal cavities as well as help with mucous clearance (this is especially helpful to do before the nasal sprays are given) - SEE RECIPE - Consider allergy shots as a means of long-term control. - Allergy shots "re-train" and "reset" the immune system to ignore environmental allergens and decrease the resulting immune response to those allergens (sneezing, itchy watery eyes, runny nose, nasal congestion, etc).    - Allergy shots improve symptoms in 75-85% of patients.  - We can discuss more at the next appointment if the medications are not working for you.  2. Return in about 3 months (around 03/12/2020). This can be an in-person, a virtual Webex or a telephone follow up visit.   Please inform us of any Emergency Department visits, hospitalizations, or changes in symptoms. Call us before going to the ED for breathing or allergy symptoms since we might be able to fit you in for a sick visit. Feel free to contact us anytime with any questions, problems, or concerns.  It was a pleasure to see you again today!  Websites that have reliable patient information: 1. American Academy of Asthma, Allergy, and Immunology: www.aaaai.org 2. Food Allergy Research and Education (FARE): foodallergy.org 3. Mothers of Asthmatics: http://www.asthmacommunitynetwork.org 4. American  College of Allergy, Asthma, and Immunology: www.acaai.org   COVID-19 Vaccine Information can be found at: ShippingScam.[email or web address].uk For questions related to vaccine distribution or appointments, please email [email or web address] or call [phone].     "Like" [place] on Facebook and Instagram for our latest updates!        Make sure you are registered to vote! If you have moved or changed any of your contact information, you will need to get this updated before voting!  In some cases, you MAY be able to register to vote online: [email or web address]     You can buy saline nose drops at a pharmacy, or you can make your own saline solution:  1. Add 1 cup (240 mL) distilled water to a clean container. If you use tap water, boil it first to sterilize it, and then let it cool until it is lukewarm.  2. Add 0.5 tsp (2.5 g) salt to the water. 3. Add 0.5 tsp (2.5 g) baking soda.   Reducing Pollen Exposure  The American Academy of Allergy, Asthma and Immunology suggests the following steps to reduce your exposure to pollen during allergy seasons.    1. Do not hang sheets or clothing out to dry; pollen may collect on these items. 2. Do not mow lawns or spend time around freshly cut grass; mowing stirs up pollen. 3. Keep windows closed at night.  Keep car windows closed while driving. 4. Minimize morning activities outdoors, a time when pollen counts are usually at their highest. 5. Stay indoors as much as possible when pollen counts or humidity is high and on windy days when pollen tends to remain in the air longer. 6. Use air conditioning when  possible.  Many air conditioners have filters that trap the pollen spores. 7. Use a HEPA room air filter to remove pollen form the indoor air you breathe.  Control of Mold Allergen   Mold and fungi can grow on a variety of surfaces provided certain temperature and  moisture conditions exist.  Outdoor molds grow on plants, decaying vegetation and soil.  The major outdoor mold, Alternaria and Cladosporium, are found in very high numbers during hot and dry conditions.  Generally, a late Summer - Fall peak is seen for common outdoor fungal spores.  Rain will temporarily lower outdoor mold spore count, but counts rise rapidly when the rainy period ends.  The most important indoor molds are Aspergillus and Penicillium.  Dark, humid and poorly ventilated basements are ideal sites for mold growth.  The next most common sites of mold growth are the bathroom and the kitchen.  Outdoor (Seasonal) Mold Control   1. Use air conditioning and keep windows closed 2. Avoid exposure to decaying vegetation. 3. Avoid leaf raking. 4. Avoid grain handling. 5. Consider wearing a face mask if working in moldy areas.    Indoor (Perennial) Mold Control    1. Maintain humidity below 50%. 2. Clean washable surfaces with 5% bleach solution. 3. Remove sources e.g. contaminated carpets.     Control of Dog or Cat Allergen  Avoidance is the best way to manage a dog or cat allergy. If you have a dog or cat and are allergic to dog or cats, consider removing the dog or cat from the home. If you have a dog or cat but don't want to find it a new home, or if your family wants a pet even though someone in the household is allergic, here are some strategies that may help keep symptoms at bay:  1. Keep the pet out of your bedroom and restrict it to only a few rooms. Be advised that keeping the dog or cat in only one room will not limit the allergens to that room. 2. Don't pet, hug or kiss the dog or cat; if you do, wash your hands with soap and water. 3. High-efficiency particulate air (HEPA) cleaners run continuously in a bedroom or living room can reduce allergen levels over time. 4. Regular use of a high-efficiency vacuum cleaner or a central vacuum can reduce allergen levels. 5.  Giving your dog or cat a bath at least once a week can reduce airborne allergen.  Control of Cockroach Allergen  Cockroach allergen has been identified as an important cause of acute attacks of asthma, especially in urban settings.  There are fifty-five species of cockroach that exist in the [place], however only three, the [place], [place] species produce allergen that can affect patients with Asthma.  Allergens can be obtained from fecal particles, egg casings and secretions from cockroaches.    1. Remove food sources. 2. Reduce access to water. 3. Seal access and entry points. 4. Spray runways with 0.5-1% Diazinon or Chlorpyrifos 5. Blow boric acid power under stoves and refrigerator. 6. Place bait stations (hydramethylnon) at feeding sites.  Allergy Shots   Allergies are the result of a chain reaction that starts in the immune system. Your immune system controls how your body defends itself. For instance, if you have an allergy to pollen, your immune system identifies pollen as an invader or allergen. Your immune system overreacts by producing antibodies called Immunoglobulin E (IgE). These antibodies travel to cells that release chemicals, causing an  allergic reaction.  The concept behind allergy immunotherapy, whether it is received in the form of shots or tablets, is that the immune system can be desensitized to specific allergens that trigger allergy symptoms. Although it requires time and patience, the payback can be long-term relief.  How Do Allergy Shots Work?  Allergy shots work much like a vaccine. Your body responds to injected amounts of a particular allergen given in increasing doses, eventually developing a resistance and tolerance to it. Allergy shots can lead to decreased, minimal or no allergy symptoms.  There generally are two phases: build-up and maintenance. Build-up often ranges from three to six months and involves receiving injections with  increasing amounts of the allergens. The shots are typically given once or twice a week, though more rapid build-up schedules are sometimes used.  The maintenance phase begins when the most effective dose is reached. This dose is different for each person, depending on how allergic you are and your response to the build-up injections. Once the maintenance dose is reached, there are longer periods between injections, typically two to four weeks.  Occasionally doctors give cortisone-type shots that can temporarily reduce allergy symptoms. These types of shots are different and should not be confused with allergy immunotherapy shots.  Who Can Be Treated with Allergy Shots?  Allergy shots may be a good treatment approach for people with allergic rhinitis (hay fever), allergic asthma, conjunctivitis (eye allergy) or stinging insect allergy.   Before deciding to begin allergy shots, you should consider:  . The length of allergy season and the severity of your symptoms . Whether medications and/or changes to your environment can control your symptoms . Your desire to avoid long-term medication use . Time: allergy immunotherapy requires a major time commitment . Cost: may vary depending on your insurance coverage  Allergy shots for children age 85 and older are effective and often well tolerated. They might prevent the onset of new allergen sensitivities or the progression to asthma.  Allergy shots are not started on patients who are pregnant but can be continued on patients who become pregnant while receiving them. In some patients with other medical conditions or who take certain common medications, allergy shots may be of risk. It is important to mention other medications you talk to your allergist.   When Will I Feel Better?  Some may experience decreased allergy symptoms during the build-up phase. For others, it may take as long as 12 months on the maintenance dose. If there is no improvement  after a year of maintenance, your allergist will discuss other treatment options with you.  If you aren't responding to allergy shots, it may be because there is not enough dose of the allergen in your vaccine or there are missing allergens that were not identified during your allergy testing. Other reasons could be that there are high levels of the allergen in your environment or major exposure to non-allergic triggers like tobacco smoke.  What Is the Length of Treatment?  Once the maintenance dose is reached, allergy shots are generally continued for three to five years. The decision to stop should be discussed with your allergist at that time. Some people may experience a permanent reduction of allergy symptoms. Others may relapse and a longer course of allergy shots can be considered.  What Are the Possible Reactions?  The two types of adverse reactions that can occur with allergy shots are local and systemic. Common local reactions include very mild redness and swelling at the injection  site, which can happen immediately or several hours after. A systemic reaction, which is less common, affects the entire body or a particular body system. They are usually mild and typically respond quickly to medications. Signs include increased allergy symptoms such as sneezing, a stuffy nose or hives.  Rarely, a serious systemic reaction called anaphylaxis can develop. Symptoms include swelling in the throat, wheezing, a feeling of tightness in the chest, nausea or dizziness. Most serious systemic reactions develop within 30 minutes of allergy shots. This is why it is strongly recommended you wait in your doctor's office for 30 minutes after your injections. Your allergist is trained to watch for reactions, and his or her staff is trained and equipped with the proper medications to identify and treat them.  Who Should Administer Allergy Shots?  The preferred location for receiving shots is your prescribing  allergist's office. Injections can sometimes be given at another facility where the physician and staff are trained to recognize and treat reactions, and have received instructions by your prescribing allergist.

## 2019-12-15 ENCOUNTER — Ambulatory Visit (INDEPENDENT_AMBULATORY_CARE_PROVIDER_SITE_OTHER): Payer: BC Managed Care – PPO | Admitting: Physician Assistant

## 2019-12-15 ENCOUNTER — Encounter: Payer: Self-pay | Admitting: Physician Assistant

## 2019-12-15 DIAGNOSIS — F329 Major depressive disorder, single episode, unspecified: Secondary | ICD-10-CM

## 2019-12-15 DIAGNOSIS — F411 Generalized anxiety disorder: Secondary | ICD-10-CM

## 2019-12-15 MED ORDER — BUPROPION HCL ER (XL) 300 MG PO TB24: ORAL_TABLET | ORAL | 1 refills | Status: DC

## 2019-12-15 MED ORDER — CITALOPRAM HYDROBROMIDE 20 MG PO TABS: 30.0000 mg | ORAL_TABLET | Freq: Every day | ORAL | 1 refills | Status: DC

## 2019-12-15 NOTE — Progress Notes (Signed)
[facility]  Patient ID: Alexa Cohen,  MRN: [id number]  PCP: Estanislado Pandy, MD  Date of Evaluation: 12/15/2019 Time spent:20 minutes  Chief Complaint:  Chief Complaint    Anxiety; Depression; Follow-up      HISTORY/CURRENT STATUS: HPI for routine med check.  Patient has not been seen since December 2019, for various reasons including COVID, having to reschedule on her part and then my part.  She has been doing very well for the most part though and has not needed any medication changes or anything.  The wintertime is usually a little tough for her, when she is unable to garden, "which is my happy place."  But she is already through half of it, her son is getting married on February 13 and she is looking forward to that so that she is feeling good about this season.  And is looking forward to spring.  She still wants to consider weaning down on the Celexa.  We had talked about that at the last visit.  But she has wanted to wait until spring or summer.  She is able to enjoy things.  She has energy and motivation to enjoy doing things.  She does not cry easily for no reason but does find herself being a little tearful like about her son getting married and she will be empty next thing again.  She reports no change in memory or focus.  Denies suicidal or homicidal thoughts.  She does have anxiety on occasion and very rarely takes the Ativan.  It is effective when she does need it.  She sleeps well most of the time.  Denies dizziness, syncope, seizures, numbness, tingling, tremor, tics, unsteady gait, slurred speech, confusion. Denies muscle or joint pain, stiffness, or dystonia.  Individual Medical History/ Review of Systems: Changes? :No    Past medications for mental health diagnoses include: Celexa, Wellbutrin XL, Ativan  Allergies: Patient has no known allergies.  Current Medications:  Current Outpatient Medications:  .  acetaminophen (TYLENOL) 500 MG tablet,  Take 500 mg by mouth every 6 (six) hours as needed., Disp: , Rfl:  .  azelastine (ASTELIN) 0.1 % nasal spray, Place 2 sprays into both nostrils 2 (two) times daily., Disp: 30 mL, Rfl: 5 .  buPROPion (WELLBUTRIN XL) 300 MG 24 hr tablet, TAKE 1 TABLET BY MOUTH ONCE DAILY IN THE MORNING, Disp: 90 tablet, Rfl: 1 .  cetirizine (ZYRTEC) 10 MG tablet, Take 10 mg by mouth daily., Disp: , Rfl:  .  citalopram (CELEXA) 20 MG tablet, Take 1.5 tablets (30 mg total) by mouth daily., Disp: 135 tablet, Rfl: 1 .  fluticasone (FLONASE) 50 MCG/ACT nasal spray, Place 1 spray into both nostrils daily as needed for allergies or rhinitis., Disp: , Rfl:  .  guaiFENesin (MUCINEX) 600 MG 12 hr tablet, Take 1,200 mg by mouth 2 (two) times daily as needed., Disp: , Rfl:  .  ibuprofen (ADVIL) 200 MG tablet, Take 200 mg by mouth every 6 (six) hours as needed., Disp: , Rfl:  .  LORazepam (ATIVAN) 0.5 MG tablet, Take 0.5 mg by mouth 3 (three) times daily as needed for anxiety., Disp: , Rfl:  .  montelukast (SINGULAIR) 10 MG tablet, Take 1 tablet (10 mg total) by mouth at bedtime., Disp: 30 tablet, Rfl: 5 Medication Side Effects: none  Family Medical/ Social History: Changes? Yes Both her kids graduated from college, son w/ Best boy in PT and dtr  W/ Masters in [employer]  EXAM:  Last menstrual period 10/06/2016.There is no height or weight on file to calculate BMI.  General Appearance: Casual, Neat and Well Groomed  Eye Contact:  Good  Speech:  Clear and Coherent  Volume:  Normal  Mood:  Euthymic  Affect:  Appropriate  Thought Process:  Goal Directed  Orientation:  Full (Time, Place, and Person)  Thought Content: Logical   Suicidal Thoughts:  No  Homicidal Thoughts:  No  Memory:  WNL  Judgement:  Good  Insight:  Good  Psychomotor Activity:  Normal  Concentration:  Concentration: Good  Recall:  Good  Fund of Knowledge: Good  Language: Good  Assets:  Desire for Improvement  ADL's:  Intact   Cognition: WNL  Prognosis:  Good    DIAGNOSES:    ICD-10-CM   1. 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 Generalized anxiety disorder  F41.1     Receiving Psychotherapy: Yes Fred May, [facility] prn   RECOMMENDATIONS:  I am glad to see her doing so well! PDMP was reviewed. Continue Ativan 0.5 mg, 1/2-1 3 times daily as needed.  She takes it extremely rarely. Continue Wellbutrin XL 300 mg 1 every morning. Continue Celexa 20 mg, 1.5 pills daily.  If she would like to decrease down to 20 mg daily, that is fine to do.  She will think about it in a few months. Continue therapy with Georgana Curio, [facility] as needed. Return in 6 months.  Donnal Moat, PA-C

## 2020-03-14 ENCOUNTER — Encounter: Payer: Self-pay | Admitting: Allergy & Immunology

## 2020-03-14 ENCOUNTER — Ambulatory Visit (INDEPENDENT_AMBULATORY_CARE_PROVIDER_SITE_OTHER): Payer: Self-pay | Admitting: Allergy & Immunology

## 2020-03-14 ENCOUNTER — Other Ambulatory Visit: Payer: Self-pay

## 2020-03-14 VITALS — BP 102/80 | HR 80 | Temp 98.7°F | Resp 18 | Ht 63.0 in

## 2020-03-14 DIAGNOSIS — J3089 Other allergic rhinitis: Secondary | ICD-10-CM

## 2020-03-14 DIAGNOSIS — J302 Other seasonal allergic rhinitis: Secondary | ICD-10-CM

## 2020-03-14 DIAGNOSIS — T63481D Toxic effect of venom of other arthropod, accidental (unintentional), subsequent encounter: Secondary | ICD-10-CM

## 2020-03-14 NOTE — Patient Instructions (Addendum)
1. Seasonal and perennial allergic rhinitis (ragweed, weeds, trees, indoor molds, outdoor molds, dog and cockroach) - It seems that you have a good grasp on your medications. - Continue with: Flonase (fluticasone) two sprays per nostril daily and Zyrtec (cetirizine) 10mg  tablet in the morning combined with Singulair (montelukast) 10mg  daily and Astelin (azelastine) 2 sprays per nostril at night.  - Consider allergy shots as a means of long-term control. - Allergy shots "re-train" and "reset" the immune system to ignore environmental allergens and decrease the resulting immune response to those allergens (sneezing, itchy watery eyes, runny nose, nasal congestion, etc).    - Allergy shots improve symptoms in 75-85% of patients.   2. Return in about 6 months (around 09/14/2020). This can be an in-person, a virtual Webex or a telephone follow up visit.   Please inform [phone] of any Emergency Department visits, hospitalizations, or changes in symptoms. Call 13/03/2020 before going to the ED for breathing or allergy symptoms since we might be able to fit you in for a sick visit. Feel free to contact us anytime with any questions, problems, or concerns.  It was a pleasure to see you again today!  Websites that have reliable patient information: 1. American Academy of Asthma, Allergy, and Immunology: www.aaaai.org 2. Food Allergy Research and Education (FARE): foodallergy.org 3. Mothers of Asthmatics: http://www.asthmacommunitynetwork.org 4. American College of Allergy, Asthma, and Immunology: www.acaai.org   COVID-19 Vaccine Information can be found at: [place] For questions related to vaccine distribution or appointments, please email [email or web address] or call [phone].     "Like" [email or web address] on Facebook and Instagram for our latest updates!       HAPPY SPRING!  Make sure you are registered to vote! If you have moved or changed any of  your contact information, you will need to get this updated before voting!  In some cases, you MAY be able to register to vote online: [phone]

## 2020-03-14 NOTE — Progress Notes (Signed)
FOLLOW UP  Date of Service/Encounter:  03/14/20   Assessment:   Seasonal and perennial allergic rhinitis (ragweed, weeds, trees, indoor molds, outdoor molds, dog and cockroach)  Stinging insect hypersensitivity    Alexa Cohen is doing very well on the combination of medications.  She seems to have a very good handle on her symptoms.  I did offer her the use of allergen immunotherapy if her symptoms are not well controlled with the medications in the future, but at this point I think it is fine for her to just continue without the allergy shots.   Plan/Recommendations:   1. Seasonal and perennial allergic rhinitis (ragweed, weeds, trees, indoor molds, outdoor molds, dog and cockroach) - It seems that you have a good grasp on your medications. - Continue with: Flonase (fluticasone) two sprays per nostril daily and Zyrtec (cetirizine) 10mg  tablet in the morning combined with Singulair (montelukast) 10mg  daily and Astelin (azelastine) 2 sprays per nostril at night.  - Consider allergy shots as a means of long-term control. - Allergy shots "re-train" and "reset" the immune system to ignore environmental allergens and decrease the resulting immune response to those allergens (sneezing, itchy watery eyes, runny nose, nasal congestion, etc).    - Allergy shots improve symptoms in 75-85% of patients.   2. Return in about 6 months (around 09/14/2020). This can be an in-person, a virtual Webex or a telephone follow up visit.  Subjective:   Alexa Cohen is a 54 y.o. female presenting today for follow up of  Chief Complaint  Patient presents with  . Allergic Rhinitis     Alexa Cohen has a history of the following: Patient Active Problem List   Diagnosis Date Noted  . Moderate recurrent major depression ([place]) 10/20/2018  . Anxiety 10/20/2018    History obtained from: chart review and patient.  Alexa Cohen is a 54 y.o. female presenting for a follow up visit.  She was last seen in  February 2021.  At that time, she had testing that was positive to ragweed, weeds, trees, indoor and outdoor molds, dog, and cockroach.  We stopped her Claritin and started Zyrtec 1 tablet daily as well as Singulair and Astelin.  We also continued with fluticasone 2 sprays per nostril daily.  Since last visit, she has done well. She is on Zyrtec and Flonase in the morning. She then takes montelukast and azelastine at night.  This combination has allowed her to tolerate her outdoor activities.  Her husband is busy making an adult tree house and she is very active in the garden.  They recently expanded the garden.  Her husband got a Firefighter for their anniversary and has been telling extra land to allow her to expand her garden.  She is not interested in allergy shots at this time, as the medications do a good job of controlling her symptoms.  She is very happy with how well she is doing.  She does have dogs inside of the home, which is one of her big triggers, and she is not willing to kick them out of her bedroom.  Otherwise, there have been no changes to her past medical history, surgical history, family history, or social history.    Review of Systems  Constitutional: Negative.  Negative for fever, malaise/fatigue and weight loss.  HENT: Negative.  Negative for congestion, ear discharge, ear pain, sinus pain and sore throat.   Eyes: Negative for pain, discharge and redness.  Respiratory: Negative for cough, sputum production, shortness  of breath and wheezing.   Cardiovascular: Negative.  Negative for chest pain and palpitations.  Gastrointestinal: Negative for abdominal pain, constipation, diarrhea, heartburn, nausea and vomiting.  Skin: Negative.  Negative for itching and rash.  Neurological: Negative for dizziness and headaches.  Endo/Heme/Allergies: Positive for environmental allergies. Does not bruise/bleed easily.       Objective:   Blood pressure 102/80, pulse 80, temperature  98.7 F (37.1 C), temperature source Temporal, resp. rate 18, height 5\' 3"  (1.6 m), last menstrual period 10/06/2016, SpO2 97 %. Body mass index is 27.21 kg/m.   Physical Exam:  Physical Exam  Constitutional: She appears well-developed and well-nourished.  HENT:  Head: Normocephalic and atraumatic.  Right Ear: Tympanic membrane, external ear and ear canal normal.  Left Ear: Tympanic membrane, external ear and ear canal normal.  Nose: Mucosal edema and rhinorrhea present. No nasal deformity or septal deviation. No epistaxis. Right sinus exhibits no maxillary sinus tenderness and no frontal sinus tenderness. Left sinus exhibits no maxillary sinus tenderness and no frontal sinus tenderness.  Mouth/Throat: Uvula is midline and oropharynx is clear and moist. Mucous membranes are not pale and not dry.  Tonsils 2+ bilaterally.  Cobblestoning present in the posterior oropharynx.  Eyes: Pupils are equal, round, and reactive to light. Conjunctivae and EOM are normal. Right eye exhibits no chemosis and no discharge. Left eye exhibits no chemosis and no discharge. Right conjunctiva is not injected. Left conjunctiva is not injected.  Cardiovascular: Normal rate, regular rhythm and normal heart sounds.  Respiratory: Effort normal and breath sounds normal. No accessory muscle usage. No tachypnea. No respiratory distress. She has no wheezes. She has no rhonchi. She has no rales. She exhibits no tenderness.  Moving air well in all lung fields.  No increased work of breathing.  Lymphadenopathy:    She has no cervical adenopathy.  Neurological: She is alert.  Skin: No abrasion, no petechiae and no rash noted. Rash is not papular, not vesicular and not urticarial. No erythema. No pallor.  Psychiatric: She has a normal mood and affect.     Diagnostic studies: none     10/08/2016, MD  [facility]

## 2020-05-07 ENCOUNTER — Ambulatory Visit (INDEPENDENT_AMBULATORY_CARE_PROVIDER_SITE_OTHER): Payer: Self-pay | Admitting: Psychiatry

## 2020-05-07 ENCOUNTER — Other Ambulatory Visit: Payer: Self-pay

## 2020-05-07 ENCOUNTER — Encounter: Payer: Self-pay | Admitting: Psychiatry

## 2020-05-07 DIAGNOSIS — F411 Generalized anxiety disorder: Secondary | ICD-10-CM

## 2020-05-07 NOTE — Progress Notes (Signed)
      [facility] Counselor/Therapist Progress Note  Patient ID: Alexa Cohen, MRN: [id number],    Date: 05/07/2020  Time Spent: 55 minutes   Treatment Type: Individual Therapy  Reported Symptoms: anxiety  Mental Status Exam:  Appearance:   Casual     Behavior:  Appropriate  Motor:  Normal  Speech/Language:   Clear and Coherent  Affect:  Appropriate  Mood:  anxious  Thought process:  normal  Thought content:    WNL  Sensory/Perceptual disturbances:    WNL  Orientation:  oriented to person, place, time/date and situation  Attention:  Good  Concentration:  Good  Memory:  WNL  Fund of knowledge:   Good  Insight:    Good  Judgment:   Good  Impulse Control:  Good   Risk Assessment: Danger to Self:  No Self-injurious Behavior: No Danger to Others: No Duty to Warn:no Physical Aggression / Violence:No  Access to Firearms a concern: No  Gang Involvement:No   Subjective: The client comes in today because she is very anxious about her brother that lives in [place].  "He needs treatment.  He is schizophrenic and bipolar."  As the client described her brother's behavior he apparently is floridly psychotic and in a manic episode.  So far he has not displayed any behaviors that indicate that he is a danger to himself or others.  I went through the criteria for an involuntary commitment and [place] with the client.  The client feels that her brother's wife would need to do that.  I did encourage the client that if her brother became threatening to her that she could do an involuntary commitment as well.  The client spoke about how she tries to reason with her brother to help him see that what he is saying is incorrect.  I explained to the client that with this type of mental illness no amount of logic or reason will work.  She did state that there was a pastor that her brother respected.  I suggested that the family contact that pastor to get her to talk to their brother.  She  agreed that she would do this and has already been in contact with that pastor.  This current episode has been going on since the early part of June of this year. I suggested the [facility] in [place], [place].  I explained that there are psychiatric professionals there that could significantly help her brother if he was open for treatment.  Unless they go the involuntary commitment route they will need his cooperation to do anything.  The client feels like she should be able to do more than that.  I pointed out that the client's brother is an adult and the laws protect him and require an involuntary commitment to do anything against his will.  I used EMDR with the client to reduce her subjective units of distress with anxiety from 5 to less than 2 at the end of the session.  She understands that she is doing the best she can.  Interventions: Assertiveness/Communication, Motivational Interviewing, Solution-Oriented/Positive Psychology, [employer] Desensitization and Reprocessing (EMDR) and Insight-Oriented  Diagnosis:   ICD-10-CM   1. Generalized anxiety disorder  F41.1     Plan: Radical acceptance, self-care, positive self talk, psychiatric resources in [place], follow-up with pastor to talk to her brother.  Gelene Mink Alyxis Grippi, [facility]

## 2020-06-14 ENCOUNTER — Ambulatory Visit (INDEPENDENT_AMBULATORY_CARE_PROVIDER_SITE_OTHER): Payer: Self-pay | Admitting: Physician Assistant

## 2020-06-14 ENCOUNTER — Encounter: Payer: Self-pay | Admitting: Physician Assistant

## 2020-06-14 ENCOUNTER — Other Ambulatory Visit: Payer: Self-pay

## 2020-06-14 DIAGNOSIS — F331 Major depressive disorder, recurrent, moderate: Secondary | ICD-10-CM

## 2020-06-14 DIAGNOSIS — F411 Generalized anxiety disorder: Secondary | ICD-10-CM

## 2020-06-14 MED ORDER — CITALOPRAM HYDROBROMIDE 20 MG PO TABS: 30.0000 mg | ORAL_TABLET | Freq: Every day | ORAL | 1 refills | Status: DC

## 2020-06-14 MED ORDER — BUPROPION HCL ER (XL) 300 MG PO TB24: ORAL_TABLET | ORAL | 1 refills | Status: DC

## 2020-06-14 MED ORDER — LORAZEPAM 0.5 MG PO TABS: 0.5000 mg | ORAL_TABLET | Freq: Three times a day (TID) | ORAL | 0 refills | Status: DC | PRN

## 2020-06-14 NOTE — Progress Notes (Signed)
[facility]  Patient ID: Alexa Cohen,  MRN: [id number]  PCP: Estanislado Pandy, MD  Date of Evaluation: 06/14/2020 Time spent:20 minutes  Chief Complaint:  Chief Complaint    Anxiety; Depression; Follow-up      HISTORY/CURRENT STATUS: HPI for routine med check.  Stressors at home, her brother has mental illness, delusional, was IVC for 3 days. He's now taking his meds and is fairly ok right now.  She is able to enjoy things.  She has energy and motivation to enjoy doing things.  She decreased her Celexa to 20 mg b/c we had discussed it in the past, but she feels like she probably needs to go back up.  She reports no change in memory or focus.  Denies suicidal or homicidal thoughts.  Patient denies increased energy with decreased need for sleep, no increased talkativeness, no racing thoughts, no impulsivity or risky behaviors, no increased spending, no increased libido, no grandiosity, no increased irritability or anger, and no hallucinations.  Denies dizziness, syncope, seizures, numbness, tingling, tremor, tics, unsteady gait, slurred speech, confusion. Denies muscle or joint pain, stiffness, or dystonia.  Individual Medical History/ Review of Systems: Changes? :No    Past medications for mental health diagnoses include: Celexa, Wellbutrin XL, Ativan  Allergies: Patient has no known allergies.  Current Medications:  Current Outpatient Medications:    acetaminophen (TYLENOL) 500 MG tablet, Take 500 mg by mouth every 6 (six) hours as needed., Disp: , Rfl:    azelastine (ASTELIN) 0.1 % nasal spray, Place 2 sprays into both nostrils 2 (two) times daily., Disp: 30 mL, Rfl: 5   buPROPion (WELLBUTRIN XL) 300 MG 24 hr tablet, TAKE 1 TABLET BY MOUTH ONCE DAILY IN THE MORNING, Disp: 90 tablet, Rfl: 1   cetirizine (ZYRTEC) 10 MG tablet, Take 10 mg by mouth daily., Disp: , Rfl:    citalopram (CELEXA) 20 MG tablet, Take 1.5 tablets (30 mg total) by mouth daily., Disp: 135  tablet, Rfl: 1   fluticasone (FLONASE) 50 MCG/ACT nasal spray, Place 1 spray into both nostrils daily as needed for allergies or rhinitis., Disp: , Rfl:    guaiFENesin (MUCINEX) 600 MG 12 hr tablet, Take 1,200 mg by mouth 2 (two) times daily as needed., Disp: , Rfl:    ibuprofen (ADVIL) 200 MG tablet, Take 200 mg by mouth every 6 (six) hours as needed., Disp: , Rfl:    LORazepam (ATIVAN) 0.5 MG tablet, Take 1 tablet (0.5 mg total) by mouth 3 (three) times daily as needed for anxiety., Disp: 30 tablet, Rfl: 0   montelukast (SINGULAIR) 10 MG tablet, Take 1 tablet (10 mg total) by mouth at bedtime., Disp: 30 tablet, Rfl: 5 Medication Side Effects: none  Family Medical/ Social History: Changes?   MENTAL HEALTH EXAM:  Last menstrual period 10/06/2016.There is no height or weight on file to calculate BMI.  General Appearance: Casual, Neat and Well Groomed  Eye Contact:  Good  Speech:  Clear and Coherent and Normal Rate  Volume:  Normal  Mood:  Euthymic  Affect:  Appropriate  Thought Process:  Goal Directed and Descriptions of Associations: Intact  Orientation:  Full (Time, Place, and Person)  Thought Content: Logical   Suicidal Thoughts:  No  Homicidal Thoughts:  No  Memory:  WNL  Judgement:  Good  Insight:  Good  Psychomotor Activity:  Normal  Concentration:  Concentration: Good and Attention Span: Good  Recall:  Good  Fund of Knowledge: Good  Language: Good  Assets:  Desire for Improvement  ADL's:  Intact  Cognition: WNL  Prognosis:  Good    DIAGNOSES:    ICD-10-CM   1. Generalized anxiety disorder  F41.1   2. Major depressive disorder, recurrent episode, moderate (HCC)  F33.1     Receiving Psychotherapy: Yes Fred May, [facility] prn   RECOMMENDATIONS:  PDMP was reviewed. Continue Ativan 0.5 mg, 1/2-1 3 times daily as needed.  She takes it extremely rarely. Continue Wellbutrin XL 300 mg 1 every morning. Increase Celexa back to 30 mg qd.  Continue therapy with Sherron Monday,  [facility] as needed. Return in 6 months.  Melony Overly, PA-C

## 2020-06-27 ENCOUNTER — Other Ambulatory Visit: Payer: Self-pay | Admitting: Allergy & Immunology

## 2020-09-19 ENCOUNTER — Encounter: Payer: Self-pay | Admitting: Allergy & Immunology

## 2020-09-19 ENCOUNTER — Ambulatory Visit (INDEPENDENT_AMBULATORY_CARE_PROVIDER_SITE_OTHER): Payer: Self-pay | Admitting: Allergy & Immunology

## 2020-09-19 ENCOUNTER — Other Ambulatory Visit: Payer: Self-pay

## 2020-09-19 VITALS — BP 112/70 | HR 72 | Temp 98.4°F | Resp 18 | Ht 63.0 in | Wt 154.8 lb

## 2020-09-19 DIAGNOSIS — T63481D Toxic effect of venom of other arthropod, accidental (unintentional), subsequent encounter: Secondary | ICD-10-CM

## 2020-09-19 DIAGNOSIS — J3089 Other allergic rhinitis: Secondary | ICD-10-CM

## 2020-09-19 DIAGNOSIS — J302 Other seasonal allergic rhinitis: Secondary | ICD-10-CM

## 2020-09-19 MED ORDER — MONTELUKAST SODIUM 10 MG PO TABS: 10.0000 mg | ORAL_TABLET | Freq: Every day | ORAL | 5 refills | Status: DC

## 2020-09-19 MED ORDER — CETIRIZINE HCL 10 MG PO TABS: 10.0000 mg | ORAL_TABLET | Freq: Every day | ORAL | 3 refills | Status: DC

## 2020-09-19 MED ORDER — AZELASTINE HCL 0.1 % NA SOLN
2.0000 | Freq: Two times a day (BID) | NASAL | 5 refills | Status: DC
Start: 2020-09-19 — End: 2021-09-25

## 2020-09-19 NOTE — Patient Instructions (Addendum)
1. Seasonal and perennial allergic rhinitis (ragweed, weeds, trees, indoor molds, outdoor molds, dog and cockroach) - It seems that you have a good grasp on your medications. - I am very happy with how you are doing.  - Continue with: Flonase (fluticasone) two sprays per nostril daily and Zyrtec (cetirizine) 10mg  tablet in the morning combined with Singulair (montelukast) 10mg  daily and Astelin (azelastine) 2 sprays per nostril at night.  - It looks like we can avoid allergy shots at this point in time.   2. Return in about 1 year (around 09/19/2021).   Please inform of any Emergency Department visits, hospitalizations, or changes in symptoms. Call 13/08/2021 before going to the ED for breathing or allergy symptoms since we might be able to fit you in for a sick visit. Feel free to contact us anytime with any questions, problems, or concerns.  It was a pleasure to see you again today!  Websites that have reliable patient information: 1. American Academy of Asthma, Allergy, and Immunology: www.aaaai.org 2. Food Allergy Research and Education (FARE): foodallergy.org 3. Mothers of Asthmatics: http://www.asthmacommunitynetwork.org 4. American College of Allergy, Asthma, and Immunology: www.acaai.org   COVID-19 Vaccine Information can be found at: [place] For questions related to vaccine distribution or appointments, please email [email or web address] or call [phone].     "Like" [email or web address] on Facebook and Instagram for our latest updates!     HAPPY FALL!     Make sure you are registered to vote! If you have moved or changed any of your contact information, you will need to get this updated before voting!  In some cases, you MAY be able to register to vote online: [phone]

## 2020-09-19 NOTE — Progress Notes (Signed)
FOLLOW UP  Date of Service/Encounter:  09/19/20   Assessment:   Seasonal and perennial allergic rhinitis(ragweed, weeds, trees, indoor molds, outdoor molds, dog and cockroach)  Stinging insect hypersensitivity   Plan/Recommendations:   1. Seasonal and perennial allergic rhinitis (ragweed, weeds, trees, indoor molds, outdoor molds, dog and cockroach) - It seems that you have a good grasp on your medications. - I am very happy with how you are doing.  - Continue with: Flonase (fluticasone) two sprays per nostril daily and Zyrtec (cetirizine) 10mg  tablet in the morning combined with Singulair (montelukast) 10mg  daily and Astelin (azelastine) 2 sprays per nostril at night.  - It looks like we can avoid allergy shots at this point in time.   2. Return in about 1 year (around 09/19/2021).   Subjective:   Brynnlee P Brune is a 54 y.o. female presenting today for follow up of  Chief Complaint  Patient presents with  . Allergies    Eriana P Petro has a history of the following: Patient Active Problem List   Diagnosis Date Noted  . Moderate recurrent major depression (HCC) 10/20/2018  . Anxiety 10/20/2018    History obtained from: chart review and patient.  Calise is a 54 y.o. female presenting for a follow up visit. She was last seen in May 2021. At that time, she was doing very well on the combination of medications including fluticasone as well as cetirizine and azelastine. We also continued with Singulair 10mg  daily. We discussed allergen immunotherapy, but she preferred to do the medications instead.   Since the last visit, she has done well.   Allergic Rhinitis Symptom History: She takes the montelukast every day as well as cetirizine every day. She takes the nasal sprays only as needed. She does report some dry eyes that get worse in the cold weather. She uses drops because of the cold weather. She is nearly blind without her contacts and she has some trifocal lenses.  She has not had any sinus infections at all since the last visit at all.   She takes her wellbutrin and the ciltapram daily. She also has an up to date EpiPen.   Otherwise, there have been no changes to her past medical history, surgical history, family history, or social history.    Review of Systems  Constitutional: Negative.  Negative for chills, fever, malaise/fatigue and weight loss.  HENT: Negative for congestion, ear discharge, ear pain and sinus pain.   Eyes: Negative for pain, discharge and redness.  Respiratory: Negative for cough, sputum production, shortness of breath and wheezing.   Cardiovascular: Negative.  Negative for chest pain and palpitations.  Gastrointestinal: Negative for abdominal pain, constipation, diarrhea, heartburn, nausea and vomiting.  Skin: Negative.  Negative for itching and rash.  Neurological: Negative for dizziness and headaches.  Endo/Heme/Allergies: Positive for environmental allergies. Does not bruise/bleed easily.  Psychiatric/Behavioral: The patient is nervous/anxious.        Objective:   Blood pressure 112/70, pulse 72, temperature 98.4 F (36.9 C), temperature source Temporal, resp. rate 18, height 5\' 3"  (1.6 m), weight 154 lb 12.8 oz (70.2 kg), last menstrual period 10/06/2016, SpO2 96 %. Body mass index is 27.42 kg/m.   Physical Exam:  Physical Exam HENT:     Head: Normocephalic and atraumatic.     Right Ear: Tympanic membrane and ear canal normal.     Left Ear: Tympanic membrane and ear canal normal.     Nose:     Right Turbinates: Enlarged, swollen  and pale.     Left Turbinates: Enlarged, swollen and pale.     Mouth/Throat:     Comments: Cobblestoning present in the posterior oropharynx.  Pulmonary:     Comments: Moving air well in all lung fields. No increased work of breathing.  Skin:    General: Skin is warm.  Neurological:     Mental Status: She is alert.  Psychiatric:        Behavior: Behavior is cooperative.        Diagnostic studies: none   Malachi Bonds, MD  [facility]

## 2020-09-24 MED ORDER — MONTELUKAST SODIUM 10 MG PO TABS: 10.0000 mg | ORAL_TABLET | Freq: Every day | ORAL | 5 refills | Status: DC

## 2020-09-24 NOTE — Addendum Note (Signed)
Addended by: Osa Craver on: 09/24/2020 11:21 AM   Modules accepted: Orders

## 2020-12-19 ENCOUNTER — Other Ambulatory Visit: Payer: Self-pay

## 2020-12-19 ENCOUNTER — Encounter: Payer: Self-pay | Admitting: Physician Assistant

## 2020-12-19 ENCOUNTER — Ambulatory Visit (INDEPENDENT_AMBULATORY_CARE_PROVIDER_SITE_OTHER): Payer: Self-pay | Admitting: Physician Assistant

## 2020-12-19 DIAGNOSIS — F331 Major depressive disorder, recurrent, moderate: Secondary | ICD-10-CM

## 2020-12-19 DIAGNOSIS — F411 Generalized anxiety disorder: Secondary | ICD-10-CM

## 2020-12-19 MED ORDER — BUPROPION HCL ER (XL) 300 MG PO TB24: ORAL_TABLET | ORAL | 1 refills | Status: DC

## 2020-12-19 MED ORDER — CITALOPRAM HYDROBROMIDE 20 MG PO TABS: 30.0000 mg | ORAL_TABLET | Freq: Every day | ORAL | 1 refills | Status: DC

## 2020-12-19 MED ORDER — LORAZEPAM 0.5 MG PO TABS: 0.5000 mg | ORAL_TABLET | Freq: Three times a day (TID) | ORAL | 0 refills | Status: DC | PRN

## 2020-12-19 NOTE — Progress Notes (Signed)
[facility] Med Check  Patient ID: Alexa Cohen,  MRN: [id number]  PCP: Estanislado Pandy, MD  Date of Evaluation: 12/19/2020 Time spent:20 minutes  Chief Complaint:  Chief Complaint    Depression; Anxiety      HISTORY/CURRENT STATUS: HPI for routine med check.  Her brother became more psychotic and had to be IVC. He was given a shot inpatient and he got better, but had a reaction. He is taking his oral meds and is doing okay right now.  Of course that has been stressful.  Also her daughter got married a week or so ago.  That was a good stressor.  She is able to enjoy things.  She has energy and motivation to enjoy doing things.  Not isolating.  Does not cry easily.  She reports no change in memory or focus.  She has had to take a few Ativan since I last saw her, mostly for the issues with her brother.  It is helpful when needed.  States she might like to decrease the Celexa again as she has in the past.  She will see how things are going though.  Denies suicidal or homicidal thoughts.  Patient denies increased energy with decreased need for sleep, no increased talkativeness, no racing thoughts, no impulsivity or risky behaviors, no increased spending, no increased libido, no grandiosity, no increased irritability or anger, and no hallucinations.  Denies dizziness, syncope, seizures, numbness, tingling, tremor, tics, unsteady gait, slurred speech, confusion. Denies muscle or joint pain, stiffness, or dystonia.  Individual Medical History/ Review of Systems: Changes? :No    Past medications for mental health diagnoses include: Celexa, Wellbutrin XL, Ativan  Allergies: Patient has no known allergies.  Current Medications:  Current Outpatient Medications:  .  azelastine (ASTELIN) 0.1 % nasal spray, Place 2 sprays into both nostrils 2 (two) times daily., Disp: 30 mL, Rfl: 5 .  cetirizine (ZYRTEC) 10 MG tablet, Take 1 tablet (10 mg total) by mouth daily., Disp: 90 tablet, Rfl: 3 .   montelukast (SINGULAIR) 10 MG tablet, Take 1 tablet (10 mg total) by mouth at bedtime., Disp: 90 tablet, Rfl: 5 .  montelukast (SINGULAIR) 10 MG tablet, Take 1 tablet (10 mg total) by mouth at bedtime., Disp: 30 tablet, Rfl: 5 .  acetaminophen (TYLENOL) 500 MG tablet, Take 500 mg by mouth every 6 (six) hours as needed. (Patient not taking: No sig reported), Disp: , Rfl:  .  buPROPion (WELLBUTRIN XL) 300 MG 24 hr tablet, TAKE 1 TABLET BY MOUTH ONCE DAILY IN THE MORNING, Disp: 90 tablet, Rfl: 1 .  citalopram (CELEXA) 20 MG tablet, Take 1.5 tablets (30 mg total) by mouth daily., Disp: 135 tablet, Rfl: 1 .  fluticasone (FLONASE) 50 MCG/ACT nasal spray, Place 1 spray into both nostrils daily as needed for allergies or rhinitis. (Patient not taking: No sig reported), Disp: , Rfl:  .  guaiFENesin (MUCINEX) 600 MG 12 hr tablet, Take 1,200 mg by mouth 2 (two) times daily as needed. (Patient not taking: No sig reported), Disp: , Rfl:  .  ibuprofen (ADVIL) 200 MG tablet, Take 200 mg by mouth every 6 (six) hours as needed. (Patient not taking: No sig reported), Disp: , Rfl:  .  LORazepam (ATIVAN) 0.5 MG tablet, Take 1 tablet (0.5 mg total) by mouth 3 (three) times daily as needed for anxiety., Disp: 30 tablet, Rfl: 0 Medication Side Effects: none  Family Medical/ Social History: Changes? See HPI  MENTAL HEALTH EXAM:  Last menstrual period 10/06/2016.There  is no height or weight on file to calculate BMI.  General Appearance: Casual, Neat and Well Groomed  Eye Contact:  Good  Speech:  Clear and Coherent and Normal Rate  Volume:  Normal  Mood:  Euthymic  Affect:  Appropriate  Thought Process:  Goal Directed and Descriptions of Associations: Intact  Orientation:  Full (Time, Place, and Person)  Thought Content: Logical   Suicidal Thoughts:  No  Homicidal Thoughts:  No  Memory:  WNL  Judgement:  Good  Insight:  Good  Psychomotor Activity:  Normal  Concentration:  Concentration: Good and Attention Span:  Good  Recall:  Good  Fund of Knowledge: Good  Language: Good  Assets:  Desire for Improvement  ADL's:  Intact  Cognition: WNL  Prognosis:  Good    DIAGNOSES:    ICD-10-CM   1. Major depressive disorder, recurrent episode, moderate (HCC)  F33.1   2. Generalized anxiety disorder  F41.1     Receiving Psychotherapy: No  Fred May, [facility] prn   RECOMMENDATIONS:  PDMP was reviewed. I provided 20 minutes of face-to-face time during this encounter, in which we discussed the anxiety.  I know she does not like to take any medication but I am glad that she did take the Ativan throughout this stressful time and it was helpful.  She does not take it unless needed. We also discussed decreasing the Celexa.  If she is doing really well and wants to decrease back to 20 mg she can.  No need to call and let me know that. Continue Ativan 0.5 mg, 1/2-1 3 times daily as needed.  She takes it extremely rarely. Continue Wellbutrin XL 300 mg 1 every morning. Continue Celexa 30 mg qd.  Continue therapy with Sherron Monday, [facility] as needed. Return in 6 months.  Melony Overly, PA-C

## 2020-12-31 ENCOUNTER — Other Ambulatory Visit: Payer: Self-pay | Admitting: Obstetrics and Gynecology

## 2020-12-31 DIAGNOSIS — Z1231 Encounter for screening mammogram for malignant neoplasm of breast: Secondary | ICD-10-CM

## 2021-01-15 ENCOUNTER — Encounter: Payer: Self-pay | Admitting: Psychiatry

## 2021-01-15 ENCOUNTER — Ambulatory Visit (INDEPENDENT_AMBULATORY_CARE_PROVIDER_SITE_OTHER): Payer: Self-pay | Admitting: Psychiatry

## 2021-01-15 ENCOUNTER — Other Ambulatory Visit: Payer: Self-pay

## 2021-01-15 DIAGNOSIS — F331 Major depressive disorder, recurrent, moderate: Secondary | ICD-10-CM

## 2021-01-15 NOTE — Progress Notes (Signed)
      [facility] Counselor/Therapist Progress Note  Patient ID: Alexa Cohen, MRN: [id number],    Date: 01/15/2021  Time Spent: 50 minutes   Treatment Type: Individual Therapy  Reported Symptoms: irritable, sad  Mental Status Exam:  Appearance:   Casual and Well Groomed     Behavior:  Appropriate  Motor:  Normal  Speech/Language:   Clear and Coherent  Affect:  Appropriate  Mood:  irritable and sad  Thought process:  normal  Thought content:    WNL  Sensory/Perceptual disturbances:    WNL  Orientation:  oriented to person, place, time/date and situation  Attention:  Good  Concentration:  Good  Memory:  WNL  Fund of knowledge:   Good  Insight:    Good  Judgment:   Good  Impulse Control:  Good   Risk Assessment: Danger to Self:  No Self-injurious Behavior: No Danger to Others: No Duty to Warn:no Physical Aggression / Violence:No  Access to Firearms a concern: No  Gang Involvement:No   Subjective: The client states that there are a number of changes taking place in her life.  She and her husband have bought land near [address], [place] where they plan to build a cabin.  In January her youngest daughter was married.  Today that is the source of her irritation and sadness.  The client's daughter could not handle the stress of all the planning.  So the client took the lead in putting everything together.  She found that some of the people in leadership at her daughter's church were closed off from her and her husband.  At the bridal shower she felt excluded from any conversation with people that were important to her daughter that went to this church.  At the reception the pastor of the church never congratulated them.  They were not going to let the client put her daughter's wedding veil on her before the service.  The people doing the flowers removed all the flowers that the client had brought but never told her.  The client felt very disrespected and has had a hard  time dealing with this.  Today we used eye-movement focusing on the leadership of the church.  Her negative cognition was, "I was disrespected ".  She feels sadness and irritation in her chest.  It is a subjective units of distress of 5.  As the client processed she needed validation that she was not overreacting.  I explained to the client that the basic [employer]ri[employer] Knuckles witness is to be kind and engaging with everyone.  This did not happen.  The client did feel like the church was trying to cut her daughter off from her and her husband.  She has written out her ideas and plans on speaking with her daughter tonight.  As we continued to process we were able to reduce the clients irritation to his subjective units of distress of 2.  She feels much better to be able to talk with her daughter about how she was hurt.  She feels much more confident in speaking to her.  Interventions: Assertiveness/Communication, Motivational Interviewing, Solution-Oriented/Positive Psychology, [employer] Desensitization and Reprocessing (EMDR) and Insight-Oriented  Diagnosis:   ICD-10-CM   1. Major depressive disorder, recurrent episode, moderate (HCC)  F33.1     Plan: Review notes, mood independent behavior, positive self talk, self-care, assertiveness, boundaries.  Gelene Mink Marielys Trinidad, [facility]

## 2021-02-21 ENCOUNTER — Other Ambulatory Visit: Payer: Self-pay

## 2021-02-21 ENCOUNTER — Other Ambulatory Visit (INDEPENDENT_AMBULATORY_CARE_PROVIDER_SITE_OTHER): Payer: Self-pay

## 2021-02-21 ENCOUNTER — Encounter (INDEPENDENT_AMBULATORY_CARE_PROVIDER_SITE_OTHER): Payer: Self-pay | Admitting: Ophthalmology

## 2021-02-21 ENCOUNTER — Ambulatory Visit (INDEPENDENT_AMBULATORY_CARE_PROVIDER_SITE_OTHER): Payer: 59 | Admitting: Ophthalmology

## 2021-02-21 DIAGNOSIS — H25813 Combined forms of age-related cataract, bilateral: Secondary | ICD-10-CM | POA: Diagnosis not present

## 2021-02-21 DIAGNOSIS — H43813 Vitreous degeneration, bilateral: Secondary | ICD-10-CM | POA: Diagnosis not present

## 2021-02-21 DIAGNOSIS — H3581 Retinal edema: Secondary | ICD-10-CM

## 2021-02-21 DIAGNOSIS — H33311 Horseshoe tear of retina without detachment, right eye: Secondary | ICD-10-CM | POA: Diagnosis not present

## 2021-02-21 MED ORDER — PREDNISOLONE ACETATE 1 % OP SUSP: 1.0000 [drp] | Freq: Four times a day (QID) | OPHTHALMIC | 0 refills | Status: AC

## 2021-02-21 NOTE — Progress Notes (Signed)
[facility] - Clinic Note  02/21/2021     CHIEF COMPLAINT Patient presents for Retina Evaluation   HISTORY OF PRESENT ILLNESS: Alexa Cohen is a 55 y.o. female who presents to the clinic today for:   HPI    Retina Evaluation    In both eyes.  This started 2 weeks ago.  Associated Symptoms Flashes and Floaters.  I, the attending physician,  performed the HPI with the patient and updated documentation appropriately.          Comments    Patient here for Retina Evaluation. Referred by Dr Dione Booze. Patient states vision has floaters.Saw Dr Dione Booze for flashes in OS like lighting. Has has a history of tear in OD and had laser. Has a dull aching in OS,  not constant.        Last edited by Rennis Chris, MD on 02/21/2021 10:58 AM. (History)    pt is here on the referral of Dr. Fabian Sharp for concern of HST OD, pt states she had a tear in her right eye back in December that she had lasered in [place], pt states she started having fol and floaters in her left eye a couple weeks ago, she states she saw Dr. Dione Booze that day and he told her that he didn't see anything in her left eye, but may have seen another tear in her right eye, pt states she has floaters in both eyes now, pts brother had a RD a couple weeks ago  Referring physician: Olivia Canter, MD [address] [place],  [place] [address]  HISTORICAL INFORMATION:   Selected notes from the MEDICAL RECORD NUMBER Referred by Dr. Fabian Sharp for concern of HST OD LEE:  Ocular Hx- PMH-    CURRENT MEDICATIONS: Current Outpatient Medications (Ophthalmic Drugs)  Medication Sig  . prednisoLONE acetate (PRED FORTE) 1 % ophthalmic suspension Place 1 drop into the right eye 4 (four) times daily for 7 days.   No current facility-administered medications for this visit. (Ophthalmic Drugs)   Current Outpatient Medications (Other)  Medication Sig  . acetaminophen (TYLENOL) 500 MG tablet Take 500 mg by mouth every 6  (six) hours as needed. (Patient not taking: No sig reported)  . azelastine (ASTELIN) 0.1 % nasal spray Place 2 sprays into both nostrils 2 (two) times daily.  Marland Kitchen buPROPion (WELLBUTRIN XL) 300 MG 24 hr tablet TAKE 1 TABLET BY MOUTH ONCE DAILY IN THE MORNING  . cetirizine (ZYRTEC) 10 MG tablet Take 1 tablet (10 mg total) by mouth daily.  . citalopram (CELEXA) 20 MG tablet Take 1.5 tablets (30 mg total) by mouth daily.  . fluticasone (FLONASE) 50 MCG/ACT nasal spray Place 1 spray into both nostrils daily as needed for allergies or rhinitis. (Patient not taking: No sig reported)  . guaiFENesin (MUCINEX) 600 MG 12 hr tablet Take 1,200 mg by mouth 2 (two) times daily as needed. (Patient not taking: No sig reported)  . ibuprofen (ADVIL) 200 MG tablet Take 200 mg by mouth every 6 (six) hours as needed. (Patient not taking: No sig reported)  . LORazepam (ATIVAN) 0.5 MG tablet Take 1 tablet (0.5 mg total) by mouth 3 (three) times daily as needed for anxiety.  . montelukast (SINGULAIR) 10 MG tablet Take 1 tablet (10 mg total) by mouth at bedtime.  . montelukast (SINGULAIR) 10 MG tablet Take 1 tablet (10 mg total) by mouth at bedtime.   No current facility-administered medications for this visit. (Other)  REVIEW OF SYSTEMS: ROS    Positive for: Eyes   Last edited by Laddie Aquas, COA on 02/21/2021  8:23 AM. (History)       ALLERGIES No Known Allergies  PAST MEDICAL HISTORY Past Medical History:  Diagnosis Date  . Anxiety   . Depression   . Trigger finger of right hand    thumb   Past Surgical History:  Procedure Laterality Date  . NO PAST SURGERIES    . TRIGGER FINGER RELEASE Right 04/14/2016   Procedure: RIGHT THUMB TRIGGER RELEASE ;  Surgeon: Betha Loa, MD;  Location: [facility];  Service: Orthopedics;  Laterality: Right;  RIGHT THUMB TRIGGER RELEASE     FAMILY HISTORY Family History  Problem Relation Age of Onset  . Breast cancer Mother 31    SOCIAL  HISTORY Social History   Tobacco Use  . Smoking status: Never Smoker  . Smokeless tobacco: Never Used  Substance Use Topics  . Alcohol use: Yes    Comment: occas  . Drug use: No         OPHTHALMIC EXAM:  Base Eye Exam    Visual Acuity (Snellen - Linear)      Right Left   Dist cc 20/20 20/20 -2   Correction: Glasses       Tonometry (Tonopen, 8:17 AM)      Right Left   Pressure 12 12       Pupils      Dark Light Shape React APD   Right 3 2 Round Brisk None   Left 3 2 Round Brisk None       Visual Fields (Counting fingers)      Left Right    Full Full       Extraocular Movement      Right Left    Full, Ortho Full, Ortho       Neuro/Psych    Oriented x3: Yes   Mood/Affect: Normal       Dilation    Both eyes: 1.0% Mydriacyl, 2.5% Phenylephrine @ 8:17 AM        Slit Lamp and Fundus Exam    Slit Lamp Exam      Right Left   Lids/Lashes Dermatochalasis - upper lid Dermatochalasis - upper lid   Conjunctiva/Sclera White and quiet White and quiet   Cornea mild arcus, mild tear film debris mild arcus, mild tear film debris   Anterior Chamber Deep and quiet Deep and quiet   Iris Round and dilated Round and dilated   Lens 1-2+ Nuclear sclerosis, 2+ Cortical cataract 1-2+ Nuclear sclerosis, 2+ Cortical cataract   Vitreous Vitreous syneresis, no pigment, Posterior vitreous detachment, mild vitreous condensations settling inferiorly Vitreous syneresis, no pigment, Posterior vitreous detachment, mild vitreous condensations       Fundus Exam      Right Left   Disc Pink and Sharp Pink and Sharp   C/D Ratio 0.3 0.1   Macula Flat, good foveal reflex, RPE mottling, No heme or edema Flat, Good foveal reflex, mild RPE mottling, No heme or edema   Vessels mild attenuation, mild tortuousity mild attenuation, mild tortuousity   Periphery Attached, large HST tear at 0115 with good posterior laser--anterior portion incomplete; small, pigmented break at 0430--no laser     Attached; no RT/RD        Refraction    Wearing Rx      Sphere Cylinder Axis Add   Right -6.75 +0.75 170 +2.00   Left -5.50  Sphere  +2.00       Manifest Refraction (Auto)      Sphere Cylinder Axis Dist VA   Right -6.75 +1.25 180 20/20   Left -6.00 +0.25 154 20/20          IMAGING AND PROCEDURES  Imaging and Procedures for 02/21/2021  OCT, Retina - OU - Both Eyes       Right Eye Quality was good. Central Foveal Thickness: 273. Progression has no prior data. Findings include normal foveal contour, no IRF, no SRF.   Left Eye Quality was good. Central Foveal Thickness: 263. Progression has no prior data. Findings include normal foveal contour, no IRF, no SRF.   Notes *Images captured and stored on drive  Diagnosis / Impression:  NFP; no IRF/SRF OU  Clinical management:  See below  Abbreviations: NFP - Normal foveal profile. CME - cystoid macular edema. PED - pigment epithelial detachment. IRF - intraretinal fluid. SRF - subretinal fluid. EZ - ellipsoid zone. ERM - epiretinal membrane. ORA - outer retinal atrophy. ORT - outer retinal tubulation. SRHM - subretinal hyper-reflective material. IRHM - intraretinal hyper-reflective material        Repair Retinal Breaks, Laser - OD - Right Eye       LASER PROCEDURE NOTE  Procedure:  Barrier laser retinopexy using slit lamp laser, right eye   Diagnosis:   Retinal tears, right eye                     Horseshoe tear at 115 o'clock anterior to equator -- incomplete laser retiopexy                         Smaller pigmented tear at 0430  Surgeon: Rennis ChrisBrian Pearl Berlinger, MD, PhD  Anesthesia: Topical  Informed consent obtained, operative eye marked, and time out performed prior to initiation of laser.   Laser settings:  Lumenis Smart532 laser, slit lamp Lens: Mainster PRP 165 Power: 300 mW Spot size: 200 microns Duration: 30 msec  # spots: 229  Placement of laser: Using a Mainster PRP 165 contact lens at the slit lamp, laser  was placed in three confluent rows around pigmented tear at 430 oclock anterior to equator. Indirect laser was used to complete the anterior portion of laser retinopexy at 430 and 115 tears: 427 spots, 280 mW power, 70 ms duration.  Complications: None.  Patient tolerated the procedure well and received written and verbal post-procedure care information/education.                  ASSESSMENT/PLAN:    ICD-10-CM   1. Retinal tear of right eye  H33.311 Repair Retinal Breaks, Laser - OD - Right Eye  2. Retinal edema  H35.81 OCT, Retina - OU - Both Eyes  3. Posterior vitreous detachment of both eyes  H43.813   4. Combined forms of age-related cataract of both eyes  H25.813     1,2. Retinal tears, OD - The incidence, risk factors, and natural history of retinal tear was discussed with patient.   - Potential treatment options including laser retinopexy and cryotherapy discussed with patient. - large HST tear located at 0115 -- s/p laser retinopexy (Dr. Rayvon Char. [place], Dec. 2021) -- anterior portion of pexy incomplete - smaller pigmented break at 0430 - recommend laser retinopexy OD today, 04.14.22 to both tears - pt wishes to proceed with laser - RBA of procedure discussed, questions answered - informed consent obtained and signed -  see procedure note - start PF QID OD x7 days - f/u in 2 wks, DFE, OCT  3. PVD / vitreous syneresis OU  - OS with more recent onset of floaters (Feb 13 2021)  - Discussed findings and prognosis  - No RT or RD on 360 exam OS  - Reviewed s/s of RT/RD  - Strict return precautions for any such RT/RD signs/symptoms  4. Mixed Cataract OU - The symptoms of cataract, surgical options, and treatments and risks were discussed with patient. - discussed diagnosis and progression - not yet visually significant - monitor for now      Ophthalmic Meds Ordered this visit:  Meds ordered this encounter  Medications  . prednisoLONE acetate (PRED FORTE) 1 %  ophthalmic suspension    Sig: Place 1 drop into the right eye 4 (four) times daily for 7 days.    Dispense:  10 mL    Refill:  0       Return in about 2 weeks (around 03/07/2021) for retinal tears OD s/p laser 4.14.22 -- DFE/OCT.  There are no Patient Instructions on file for this visit.   Explained the diagnoses, plan, and follow up with the patient and they expressed understanding.  Patient expressed understanding of the importance of proper follow up care.   This document serves as a record of services personally performed by Karie Chimera, MD, PhD. It was created on their behalf by Glee Arvin. Manson Passey, OA an ophthalmic technician. The creation of this record is the provider's dictation and/or activities during the visit.    Electronically signed by: Glee Arvin. Manson Passey, [place] 04.14.2022 11:20 AM   Karie Chimera, M.D., Ph.D. Diseases & Surgery of the Retina and Vitreous [facility]  I have reviewed the above documentation for accuracy and completeness, and I agree with the above. Karie Chimera, M.D., Ph.D. 02/21/21 11:20 AM   Abbreviations: M myopia (nearsighted); A astigmatism; H hyperopia (farsighted); P presbyopia; Mrx spectacle prescription;  CTL contact lenses; OD right eye; OS left eye; OU both eyes  XT exotropia; ET esotropia; PEK punctate epithelial keratitis; PEE punctate epithelial erosions; DES dry eye syndrome; MGD meibomian gland dysfunction; ATs artificial tears; PFAT's preservative free artificial tears; NSC nuclear sclerotic cataract; PSC posterior subcapsular cataract; ERM epi-retinal membrane; PVD posterior vitreous detachment; RD retinal detachment; DM diabetes mellitus; DR diabetic retinopathy; NPDR non-proliferative diabetic retinopathy; PDR proliferative diabetic retinopathy; CSME clinically significant macular edema; DME diabetic macular edema; dbh dot blot hemorrhages; CWS cotton wool spot; POAG primary open angle glaucoma; C/D cup-to-disc  ratio; HVF humphrey visual field; GVF goldmann visual field; OCT optical coherence tomography; IOP intraocular pressure; BRVO Branch retinal vein occlusion; CRVO central retinal vein occlusion; CRAO central retinal artery occlusion; BRAO branch retinal artery occlusion; RT retinal tear; SB scleral buckle; PPV pars plana vitrectomy; VH Vitreous hemorrhage; PRP panretinal laser photocoagulation; IVK intravitreal kenalog; VMT vitreomacular traction; MH Macular hole;  NVD neovascularization of the disc; NVE neovascularization elsewhere; AREDS age related eye disease study; ARMD age related macular degeneration; POAG primary open angle glaucoma; EBMD epithelial/anterior basement membrane dystrophy; ACIOL anterior chamber intraocular lens; IOL intraocular lens; PCIOL posterior chamber intraocular lens; Phaco/IOL phacoemulsification with intraocular lens placement; PRK photorefractive keratectomy; LASIK laser assisted in situ keratomileusis; HTN hypertension; DM diabetes mellitus; COPD chronic obstructive pulmonary disease

## 2021-02-28 ENCOUNTER — Ambulatory Visit: Payer: BC Managed Care – PPO

## 2021-03-04 ENCOUNTER — Encounter (INDEPENDENT_AMBULATORY_CARE_PROVIDER_SITE_OTHER): Payer: Self-pay | Admitting: Ophthalmology

## 2021-03-06 NOTE — Progress Notes (Signed)
[facility] - Clinic Note  03/11/2021     CHIEF COMPLAINT Patient presents for Retina Follow Up   HISTORY OF PRESENT ILLNESS: Alexa Cohen is a 55 y.o. female who presents to the clinic today for:   HPI    Retina Follow Up    Patient presents with  Retinal Break/Detachment.  In right eye.  Duration of 2 weeks.  Since onset it is gradually improving.  I, the attending physician,  performed the HPI with the patient and updated documentation appropriately.          Comments    2 week follow up HST s/p Retinopexy OD- Doing well.  Eye seems better.  She is still seeing floaters on occasion. She believes it is OS.  Floaters are no worse. Denies FOL's.        Last edited by Rennis Chris, MD on 03/11/2021 10:40 PM. (History)    pt states no change in vision, no problems after last laser procedure, she used PF afterwards as directed   Referring physician: Estanislado Pandy, MD [id number] [place],  [place] [address]  HISTORICAL INFORMATION:   Selected notes from the MEDICAL RECORD NUMBER Referred by Dr. Fabian Sharp for concern of HST OD LEE:  Ocular Hx- PMH-    CURRENT MEDICATIONS: No current outpatient medications on file. (Ophthalmic Drugs)   No current facility-administered medications for this visit. (Ophthalmic Drugs)   Current Outpatient Medications (Other)  Medication Sig  . azelastine (ASTELIN) 0.1 % nasal spray Place 2 sprays into both nostrils 2 (two) times daily.  Marland Kitchen buPROPion (WELLBUTRIN XL) 300 MG 24 hr tablet TAKE 1 TABLET BY MOUTH ONCE DAILY IN THE MORNING  . cetirizine (ZYRTEC) 10 MG tablet Take 1 tablet (10 mg total) by mouth daily.  . citalopram (CELEXA) 20 MG tablet Take 1.5 tablets (30 mg total) by mouth daily.  Marland Kitchen LORazepam (ATIVAN) 0.5 MG tablet Take 1 tablet (0.5 mg total) by mouth 3 (three) times daily as needed for anxiety.  . montelukast (SINGULAIR) 10 MG tablet Take 1 tablet (10 mg total) by mouth at bedtime.  Marland Kitchen acetaminophen (TYLENOL)  500 MG tablet Take 500 mg by mouth every 6 (six) hours as needed. (Patient not taking: No sig reported)  . fluticasone (FLONASE) 50 MCG/ACT nasal spray Place 1 spray into both nostrils daily as needed for allergies or rhinitis. (Patient not taking: No sig reported)  . guaiFENesin (MUCINEX) 600 MG 12 hr tablet Take 1,200 mg by mouth 2 (two) times daily as needed. (Patient not taking: No sig reported)  . ibuprofen (ADVIL) 200 MG tablet Take 200 mg by mouth every 6 (six) hours as needed. (Patient not taking: No sig reported)  . montelukast (SINGULAIR) 10 MG tablet Take 1 tablet (10 mg total) by mouth at bedtime. (Patient not taking: Reported on 03/11/2021)   No current facility-administered medications for this visit. (Other)      REVIEW OF SYSTEMS: ROS    Positive for: Eyes, Psychiatric   Negative for: Constitutional, Gastrointestinal, Neurological, Skin, Genitourinary, Musculoskeletal, HENT, Endocrine, Cardiovascular, Respiratory, Allergic/Imm, Heme/Lymph   Last edited by Joni Reining, COA on 03/11/2021  9:19 AM. (History)       ALLERGIES No Known Allergies  PAST MEDICAL HISTORY Past Medical History:  Diagnosis Date  . Anxiety   . Depression   . Trigger finger of right hand    thumb   Past Surgical History:  Procedure Laterality Date  . NO PAST SURGERIES    .  TRIGGER FINGER RELEASE Right 04/14/2016   Procedure: RIGHT THUMB TRIGGER RELEASE ;  Surgeon: Betha Loa, MD;  Location: [facility];  Service: Orthopedics;  Laterality: Right;  RIGHT THUMB TRIGGER RELEASE     FAMILY HISTORY Family History  Problem Relation Age of Onset  . Breast cancer Mother 18    SOCIAL HISTORY Social History   Tobacco Use  . Smoking status: Never Smoker  . Smokeless tobacco: Never Used  Substance Use Topics  . Alcohol use: Yes    Comment: occas  . Drug use: No         OPHTHALMIC EXAM:  Base Eye Exam    Visual Acuity (Snellen - Linear)      Right Left   Dist cc 20/20  20/30-   Dist ph cc  20/25+   Near cc  J1+   Correction: Contacts  Monovision CLs.  OD dist, OS near       Tonometry (Tonopen, 9:27 AM)      Right Left   Pressure 16 17       Pupils      Dark Light Shape React APD   Right 4 3 Round Brisk None   Left 4 3 Round Brisk None       Visual Fields (Counting fingers)      Left Right    Full Full       Extraocular Movement      Right Left    Full Full       Neuro/Psych    Oriented x3: Yes   Mood/Affect: Normal       Dilation    Both eyes: 1.0% Mydriacyl, 2.5% Phenylephrine @ 9:27 AM        Slit Lamp and Fundus Exam    Slit Lamp Exam      Right Left   Lids/Lashes Dermatochalasis - upper lid Dermatochalasis - upper lid   Conjunctiva/Sclera White and quiet White and quiet   Cornea mild arcus, trace fine PEE mild arcus, mild tear film debris   Anterior Chamber Deep and quiet Deep and quiet   Iris Round and dilated Round and dilated   Lens 1-2+ Nuclear sclerosis, 2+ Cortical cataract 1-2+ Nuclear sclerosis, 2+ Cortical cataract   Vitreous Vitreous syneresis, no pigment, Posterior vitreous detachment, mild vitreous condensations settling inferiorly Vitreous syneresis, no pigment, Posterior vitreous detachment, mild vitreous condensations       Fundus Exam      Right Left   Disc Pink and Sharp Pink and Sharp   C/D Ratio 0.3 0.1   Macula Flat, good foveal reflex, RPE mottling, No heme or edema Flat, Good foveal reflex, mild RPE mottling, No heme or edema   Vessels mild attenuation, mild tortuousity mild attenuation, mild tortuousity   Periphery Attached, large HST tear at 0115 with good anterior laser fill in changes -- now with good laser surrounding entire tear; small, pigmented break at 0430 -- good laser surrounding; No new RT/RD Attached; no RT/RD        Refraction    Wearing Rx      Sphere Cylinder Axis Add   Right -6.75 +0.75 170 +2.00   Left -5.50 Sphere  +2.00          IMAGING AND PROCEDURES  Imaging and  Procedures for 03/11/2021  OCT, Retina - OU - Both Eyes       Right Eye Quality was good. Central Foveal Thickness: 276. Progression has been stable. Findings include normal foveal contour,  no IRF, no SRF.   Left Eye Quality was good. Central Foveal Thickness: 264. Progression has been stable. Findings include normal foveal contour, no IRF, no SRF.   Notes *Images captured and stored on drive  Diagnosis / Impression:  NFP; no IRF/SRF OU  Clinical management:  See below  Abbreviations: NFP - Normal foveal profile. CME - cystoid macular edema. PED - pigment epithelial detachment. IRF - intraretinal fluid. SRF - subretinal fluid. EZ - ellipsoid zone. ERM - epiretinal membrane. ORA - outer retinal atrophy. ORT - outer retinal tubulation. SRHM - subretinal hyper-reflective material. IRHM - intraretinal hyper-reflective material                 ASSESSMENT/PLAN:    ICD-10-CM   1. Retinal tear of right eye  H33.311   2. Retinal edema  H35.81 OCT, Retina - OU - Both Eyes  3. Posterior vitreous detachment of both eyes  H43.813   4. Combined forms of age-related cataract of both eyes  H25.813     1,2. Retinal tears, OD - large HST tear located at 0115 -- s/p laser retinopexy (Dr. Rayvon Char, Dec. 2021) -- anterior portion of pexy incomplete - smaller pigmented break at 0430 - s/p laser retinopexy OD, 04.14.22 -- good anterior fill in to 0115 break + good laser changes surrounding 0430 break - no new RT/RD - f/u in 3 months, DFE, OCT  3. PVD / vitreous syneresis OU  - OS with more recent onset of floaters (Feb 13 2021)  - Discussed findings and prognosis  - No RT or RD on 360 exam OS  - Reviewed s/s of RT/RD  - Strict return precautions for any such RT/RD signs/symptoms  4. Mixed Cataract OU - The symptoms of cataract, surgical options, and treatments and risks were discussed with patient. - discussed diagnosis and progression - not yet visually significant - monitor  for now  Ophthalmic Meds Ordered this visit:  No orders of the defined types were placed in this encounter.     Return in about 3 months (around 06/11/2021) for f/u retinal tears OD, DFE, OCT.  There are no Patient Instructions on file for this visit.  This document serves as a record of services personally performed by Karie Chimera, MD, PhD. It was created on their behalf by Herby Abraham, COA, an ophthalmic technician. The creation of this record is the provider's dictation and/or activities during the visit.    Electronically signed by: Herby Abraham, COA @TODAY @ 10:44 PM   This document serves as a record of services personally performed by , MD, PhD. It was created on their behalf by Karie Chimera. Glee Arvin, OA an ophthalmic technician. The creation of this record is the provider's dictation and/or activities during the visit.    Electronically signed by: Manson Passey. Glee Arvin, Manson Passey 05.02.2022 10:44 PM  07.02.2022, M.D., Ph.D. Diseases & Surgery of the Retina and Vitreous [facility] 03/11/2021   I have reviewed the above documentation for accuracy and completeness, and I agree with the above. 05/11/2021, M.D., Ph.D. 03/11/21 10:44 PM   Abbreviations: M myopia (nearsighted); A astigmatism; H hyperopia (farsighted); P presbyopia; Mrx spectacle prescription;  CTL contact lenses; OD right eye; OS left eye; OU both eyes  XT exotropia; ET esotropia; PEK punctate epithelial keratitis; PEE punctate epithelial erosions; DES dry eye syndrome; MGD meibomian gland dysfunction; ATs artificial tears; PFAT's preservative free artificial tears; NSC nuclear sclerotic cataract; PSC posterior  subcapsular cataract; ERM epi-retinal membrane; PVD posterior vitreous detachment; RD retinal detachment; DM diabetes mellitus; DR diabetic retinopathy; NPDR non-proliferative diabetic retinopathy; PDR proliferative diabetic retinopathy; CSME clinically significant macular  edema; DME diabetic macular edema; dbh dot blot hemorrhages; CWS cotton wool spot; POAG primary open angle glaucoma; C/D cup-to-disc ratio; HVF humphrey visual field; GVF goldmann visual field; OCT optical coherence tomography; IOP intraocular pressure; BRVO Branch retinal vein occlusion; CRVO central retinal vein occlusion; CRAO central retinal artery occlusion; BRAO branch retinal artery occlusion; RT retinal tear; SB scleral buckle; PPV pars plana vitrectomy; VH Vitreous hemorrhage; PRP panretinal laser photocoagulation; IVK intravitreal kenalog; VMT vitreomacular traction; MH Macular hole;  NVD neovascularization of the disc; NVE neovascularization elsewhere; AREDS age related eye disease study; ARMD age related macular degeneration; POAG primary open angle glaucoma; EBMD epithelial/anterior basement membrane dystrophy; ACIOL anterior chamber intraocular lens; IOL intraocular lens; PCIOL posterior chamber intraocular lens; Phaco/IOL phacoemulsification with intraocular lens placement; PRK photorefractive keratectomy; LASIK laser assisted in situ keratomileusis; HTN hypertension; DM diabetes mellitus; COPD chronic obstructive pulmonary disease

## 2021-03-11 ENCOUNTER — Encounter (INDEPENDENT_AMBULATORY_CARE_PROVIDER_SITE_OTHER): Payer: Self-pay | Admitting: Ophthalmology

## 2021-03-11 ENCOUNTER — Ambulatory Visit (INDEPENDENT_AMBULATORY_CARE_PROVIDER_SITE_OTHER): Payer: 59 | Admitting: Ophthalmology

## 2021-03-11 ENCOUNTER — Other Ambulatory Visit: Payer: Self-pay

## 2021-03-11 DIAGNOSIS — H25813 Combined forms of age-related cataract, bilateral: Secondary | ICD-10-CM

## 2021-03-11 DIAGNOSIS — H3581 Retinal edema: Secondary | ICD-10-CM | POA: Diagnosis not present

## 2021-03-11 DIAGNOSIS — H33311 Horseshoe tear of retina without detachment, right eye: Secondary | ICD-10-CM | POA: Diagnosis not present

## 2021-03-11 DIAGNOSIS — H43813 Vitreous degeneration, bilateral: Secondary | ICD-10-CM

## 2021-03-14 ENCOUNTER — Ambulatory Visit (INDEPENDENT_AMBULATORY_CARE_PROVIDER_SITE_OTHER): Payer: 59

## 2021-03-14 ENCOUNTER — Ambulatory Visit (INDEPENDENT_AMBULATORY_CARE_PROVIDER_SITE_OTHER): Payer: 59 | Admitting: Podiatry

## 2021-03-14 ENCOUNTER — Encounter: Payer: Self-pay | Admitting: Podiatry

## 2021-03-14 ENCOUNTER — Other Ambulatory Visit: Payer: Self-pay

## 2021-03-14 DIAGNOSIS — M2012 Hallux valgus (acquired), left foot: Secondary | ICD-10-CM

## 2021-03-14 DIAGNOSIS — R2689 Other abnormalities of gait and mobility: Secondary | ICD-10-CM

## 2021-03-14 DIAGNOSIS — M25373 Other instability, unspecified ankle: Secondary | ICD-10-CM

## 2021-03-14 DIAGNOSIS — M2041 Other hammer toe(s) (acquired), right foot: Secondary | ICD-10-CM

## 2021-03-14 DIAGNOSIS — R29898 Other symptoms and signs involving the musculoskeletal system: Secondary | ICD-10-CM

## 2021-03-14 DIAGNOSIS — M2011 Hallux valgus (acquired), right foot: Secondary | ICD-10-CM

## 2021-03-14 DIAGNOSIS — M2042 Other hammer toe(s) (acquired), left foot: Secondary | ICD-10-CM

## 2021-03-14 DIAGNOSIS — H2510 Age-related nuclear cataract, unspecified eye: Secondary | ICD-10-CM | POA: Insufficient documentation

## 2021-03-14 DIAGNOSIS — H4311 Vitreous hemorrhage, right eye: Secondary | ICD-10-CM | POA: Insufficient documentation

## 2021-03-14 NOTE — Progress Notes (Signed)
Subjective:  Patient ID: Alexa Cohen, female    DOB: [birth year],  MRN: [id number] HPI Chief Complaint  Patient presents with  . Foot Pain    Multiple foot concerns - bunions 1st and 5th MPJ bilateral, arch pain right, shooting pains through 4th toe left, dorsal midfoot left - all areas have been aching for months, trouble finding comfortable shoes and walking for long periods  . New Patient (Initial Visit)    55 y.o. female presents with the above complaint.   ROS: Denies fever chills nausea vomiting muscle aches pains calf pain back pain chest pain shortness of breath.  Past Medical History:  Diagnosis Date  . Anxiety   . Depression   . Trigger finger of right hand    thumb   Past Surgical History:  Procedure Laterality Date  . NO PAST SURGERIES    . TRIGGER FINGER RELEASE Right 04/14/2016   Procedure: RIGHT THUMB TRIGGER RELEASE ;  Surgeon: Betha Loa, MD;  Location: [facility];  Service: Orthopedics;  Laterality: Right;  RIGHT THUMB TRIGGER RELEASE     Current Outpatient Medications:  .  acetaminophen (TYLENOL) 500 MG tablet, Take 500 mg by mouth every 6 (six) hours as needed. (Patient not taking: No sig reported), Disp: , Rfl:  .  azelastine (ASTELIN) 0.1 % nasal spray, Place 2 sprays into both nostrils 2 (two) times daily., Disp: 30 mL, Rfl: 5 .  buPROPion (WELLBUTRIN XL) 300 MG 24 hr tablet, TAKE 1 TABLET BY MOUTH ONCE DAILY IN THE MORNING, Disp: 90 tablet, Rfl: 1 .  cetirizine (ZYRTEC) 10 MG tablet, Take 1 tablet (10 mg total) by mouth daily., Disp: 90 tablet, Rfl: 3 .  citalopram (CELEXA) 20 MG tablet, Take 1.5 tablets (30 mg total) by mouth daily., Disp: 135 tablet, Rfl: 1 .  fluticasone (FLONASE) 50 MCG/ACT nasal spray, Place 1 spray into both nostrils daily as needed for allergies or rhinitis. (Patient not taking: No sig reported), Disp: , Rfl:  .  guaiFENesin (MUCINEX) 600 MG 12 hr tablet, Take 1,200 mg by mouth 2 (two) times daily as needed. (Patient  not taking: No sig reported), Disp: , Rfl:  .  ibuprofen (ADVIL) 200 MG tablet, Take 200 mg by mouth every 6 (six) hours as needed. (Patient not taking: No sig reported), Disp: , Rfl:  .  LORazepam (ATIVAN) 0.5 MG tablet, Take 1 tablet (0.5 mg total) by mouth 3 (three) times daily as needed for anxiety., Disp: 30 tablet, Rfl: 0 .  montelukast (SINGULAIR) 10 MG tablet, Take 1 tablet (10 mg total) by mouth at bedtime. (Patient not taking: Reported on 03/11/2021), Disp: 90 tablet, Rfl: 5 .  montelukast (SINGULAIR) 10 MG tablet, Take 1 tablet (10 mg total) by mouth at bedtime., Disp: 30 tablet, Rfl: 5  No Known Allergies Review of Systems Objective:  There were no vitals filed for this visit.  General: Well developed, nourished, in no acute distress, alert and oriented x3   Dermatological: Skin is warm, dry and supple bilateral. Nails x 10 are well maintained; remaining integument appears unremarkable at this time. There are no open sores, no preulcerative lesions, no rash or signs of infection present.  Vascular: Dorsalis Pedis artery and Posterior Tibial artery pedal pulses are 2/4 bilateral with immedate capillary fill time. Pedal hair growth present. No varicosities and no lower extremity edema present bilateral.   Neruologic: Grossly intact via light touch bilateral. Vibratory intact via tuning fork bilateral. Protective threshold with Semmes Wienstein monofilament  intact to all pedal sites bilateral. Patellar and Achilles deep tendon reflexes 2+ bilateral. No Babinski or clonus noted bilateral.   Musculoskeletal: No gross boney pedal deformities bilateral. No pain, crepitus, or limitation noted with foot and ankle range of motion bilateral. Muscular strength 5/5 in all groups tested bilateral.  Mild hallux abductovalgus deformities mild tailor's bunion deformities bilateral.  No reproducible pain on palpation of the interdigital space with exception understands small amount of tenderness between  the third and fourth metatarsals.  She has no reproducible pain on palpation medial longitudinal arch right.  Gait: Unassisted, Nonantalgic.    Radiographs:  Radiographs taken today demonstrate an osseously mature individual with very mild hallux abductovalgus deformity and tailor's bunion deformities bilateral.  She has medial deviation of the fifth toes left greater than right encroaching on fourth toe.  She has moderate arches on lateral view and no significant osseous abnormalities nor any major soft tissue thickenings.  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  Plan: Discussed etiology pathology conservative versus surgical therapies.  We discussed appropriate shoe gear stretching exercise ice therapy shoe modifications.  We also discussed where to find this appropriate shoe gear making sure that the forefoot is wide enough and deep enough to accommodate the bunion and the tailor's bunions.  She understands this is amenable to it.  I explained to her that should a new pair of shoes not help with her arch pain right we would make her a set of orthotics.     Marvelous Woolford T. [place], [place]

## 2021-03-15 ENCOUNTER — Other Ambulatory Visit: Payer: Self-pay | Admitting: Physician Assistant

## 2021-04-16 ENCOUNTER — Ambulatory Visit
Admission: RE | Admit: 2021-04-16 | Discharge: 2021-04-16 | Disposition: A | Discharge status: Home / Self Care | Payer: No Typology Code available for payment source | Source: Ambulatory Visit | Attending: Obstetrics and Gynecology | Admitting: Obstetrics and Gynecology

## 2021-04-16 ENCOUNTER — Other Ambulatory Visit: Payer: Self-pay

## 2021-04-16 DIAGNOSIS — Z1231 Encounter for screening mammogram for malignant neoplasm of breast: Secondary | ICD-10-CM

## 2021-04-29 DIAGNOSIS — F32A Depression, unspecified: Secondary | ICD-10-CM | POA: Insufficient documentation

## 2021-06-10 ENCOUNTER — Encounter (INDEPENDENT_AMBULATORY_CARE_PROVIDER_SITE_OTHER): Payer: 59 | Admitting: Ophthalmology

## 2021-06-10 ENCOUNTER — Ambulatory Visit (INDEPENDENT_AMBULATORY_CARE_PROVIDER_SITE_OTHER): Payer: 59 | Admitting: Ophthalmology

## 2021-06-10 ENCOUNTER — Other Ambulatory Visit: Payer: Self-pay

## 2021-06-10 DIAGNOSIS — H43813 Vitreous degeneration, bilateral: Secondary | ICD-10-CM

## 2021-06-10 DIAGNOSIS — H3581 Retinal edema: Secondary | ICD-10-CM

## 2021-06-10 DIAGNOSIS — H33311 Horseshoe tear of retina without detachment, right eye: Secondary | ICD-10-CM

## 2021-06-10 DIAGNOSIS — H25813 Combined forms of age-related cataract, bilateral: Secondary | ICD-10-CM

## 2021-06-10 NOTE — Progress Notes (Signed)
[facility] - Clinic Note  06/10/2021     CHIEF COMPLAINT Patient presents for Retina Follow Up   HISTORY OF PRESENT ILLNESS: Alexa Cohen is a 55 y.o. female who presents to the clinic today for:   HPI     Retina Follow Up   Patient presents with  Retinal Break/Detachment.  In right eye.  Duration of 3 months.  Since onset it is stable.  I, the attending physician,  performed the HPI with the patient and updated documentation appropriately.        Comments   3 month follow up h/o Retinal tear OD-  Her vision hasn't been as good as it once was.  Some days are better than others.  She has seen Dr. Dione Booze since last visit and told there was not change in her glasses Rx.  Patient has to blink a lot in order to clear eyes up. Still having floaters but no increase in them or FOLs.       Last edited by Rennis Chris, MD on 06/11/2021 11:34 PM.    pt states she still has some floaters and vision is not completely clear, she states vision comes and goes, especially when reading  Referring physician: Olivia Canter, MD [address] [place],  [place] [address]  HISTORICAL INFORMATION:   Selected notes from the MEDICAL RECORD NUMBER Referred by Dr. Fabian Sharp for concern of HST OD LEE:  Ocular Hx- PMH-    CURRENT MEDICATIONS: No current outpatient medications on file. (Ophthalmic Drugs)   No current facility-administered medications for this visit. (Ophthalmic Drugs)   Current Outpatient Medications (Other)  Medication Sig   azelastine (ASTELIN) 0.1 % nasal spray Place 2 sprays into both nostrils 2 (two) times daily.   buPROPion (WELLBUTRIN XL) 300 MG 24 hr tablet TAKE 1 TABLET BY MOUTH ONCE DAILY IN THE MORNING   cetirizine (ZYRTEC) 10 MG tablet Take 1 tablet (10 mg total) by mouth daily.   citalopram (CELEXA) 20 MG tablet Take 1.5 tablets (30 mg total) by mouth daily.   LORazepam (ATIVAN) 0.5 MG tablet Take 1 tablet (0.5 mg total) by mouth 3  (three) times daily as needed for anxiety.   montelukast (SINGULAIR) 10 MG tablet Take 1 tablet (10 mg total) by mouth at bedtime.   acetaminophen (TYLENOL) 500 MG tablet Take 500 mg by mouth every 6 (six) hours as needed. (Patient not taking: No sig reported)   fluticasone (FLONASE) 50 MCG/ACT nasal spray Place 1 spray into both nostrils daily as needed for allergies or rhinitis. (Patient not taking: No sig reported)   guaiFENesin (MUCINEX) 600 MG 12 hr tablet Take 1,200 mg by mouth 2 (two) times daily as needed. (Patient not taking: No sig reported)   ibuprofen (ADVIL) 200 MG tablet Take 200 mg by mouth every 6 (six) hours as needed. (Patient not taking: No sig reported)   montelukast (SINGULAIR) 10 MG tablet Take 1 tablet (10 mg total) by mouth at bedtime.   No current facility-administered medications for this visit. (Other)   REVIEW OF SYSTEMS: ROS   Positive for: Eyes, Psychiatric Negative for: Constitutional, Gastrointestinal, Neurological, Skin, Genitourinary, Musculoskeletal, HENT, Endocrine, Cardiovascular, Respiratory, Allergic/Imm, Heme/Lymph Last edited by Joni Reining, COA on 06/10/2021  2:31 PM.     ALLERGIES No Known Allergies  PAST MEDICAL HISTORY Past Medical History:  Diagnosis Date   Anxiety    Depression    Trigger finger of right hand  thumb   Past Surgical History:  Procedure Laterality Date   NO PAST SURGERIES     TRIGGER FINGER RELEASE Right 04/14/2016   Procedure: RIGHT THUMB TRIGGER RELEASE ;  Surgeon: Betha Loa, MD;  Location: [facility];  Service: Orthopedics;  Laterality: Right;  RIGHT THUMB TRIGGER RELEASE     FAMILY HISTORY Family History  Problem Relation Age of Onset   Breast cancer Mother 4    SOCIAL HISTORY Social History   Tobacco Use   Smoking status: Never   Smokeless tobacco: Never  Substance Use Topics   Alcohol use: Yes    Comment: occas   Drug use: No         OPHTHALMIC EXAM:  Base Eye Exam      Visual Acuity (Snellen - Linear)       Right Left   Dist cc 20/20 20/20         Tonometry (Tonopen, 2:37 PM)       Right Left   Pressure 12 13         Pupils       Dark Light Shape React APD   Right 4 3 Round Brisk None   Left 4 3 Round Brisk None         Visual Fields (Counting fingers)       Left Right    Full Full         Extraocular Movement       Right Left    Full Full         Neuro/Psych     Oriented x3: Yes   Mood/Affect: Normal         Dilation     Both eyes: 1.0% Mydriacyl, 2.5% Phenylephrine @ 2:37 PM           Slit Lamp and Fundus Exam     Slit Lamp Exam       Right Left   Lids/Lashes Dermatochalasis - upper lid Dermatochalasis - upper lid   Conjunctiva/Sclera White and quiet White and quiet   Cornea mild arcus, trace fine PEE, mild tear film debris mild arcus, mild tear film debris   Anterior Chamber Deep and quiet Deep and quiet   Iris Round and dilated Round and dilated   Lens 1-2+ Nuclear sclerosis, 2+ Cortical cataract 1-2+ Nuclear sclerosis, 2+ Cortical cataract   Vitreous Vitreous syneresis, no pigment, Posterior vitreous detachment, mild vitreous condensations settling inferiorly Vitreous syneresis, no pigment, Posterior vitreous detachment, mild vitreous condensations         Fundus Exam       Right Left   Disc Pink and Sharp Pink and Sharp   C/D Ratio 0.3 0.1   Macula Flat, good foveal reflex, RPE mottling, No heme or edema Flat, Good foveal reflex, mild RPE mottling, No heme or edema   Vessels mild attenuation, mild tortuousity mild attenuation, mild tortuousity   Periphery Attached, large HST tear at 0115 with good anterior laser fill in changes -- now with good laser surrounding entire tear; small, pigmented break at 0430 -- good laser surrounding; No new RT/RD Attached; no RT/RD            IMAGING AND PROCEDURES  Imaging and Procedures for 06/10/2021  OCT, Retina - OU - Both Eyes       Right  Eye Quality was good. Central Foveal Thickness: 277. Progression has been stable. Findings include normal foveal contour, no IRF, no SRF.   Left Eye Quality was  good. Central Foveal Thickness: 263. Progression has been stable. Findings include normal foveal contour, no IRF, no SRF.   Notes *Images captured and stored on drive  Diagnosis / Impression:  NFP; no IRF/SRF OU  Clinical management:  See below  Abbreviations: NFP - Normal foveal profile. CME - cystoid macular edema. PED - pigment epithelial detachment. IRF - intraretinal fluid. SRF - subretinal fluid. EZ - ellipsoid zone. ERM - epiretinal membrane. ORA - outer retinal atrophy. ORT - outer retinal tubulation. SRHM - subretinal hyper-reflective material. IRHM - intraretinal hyper-reflective material               ASSESSMENT/PLAN:    ICD-10-CM   1. Retinal tear of right eye  H33.311     2. Retinal edema  H35.81 OCT, Retina - OU - Both Eyes    3. Posterior vitreous detachment of both eyes  H43.813     4. Combined forms of age-related cataract of both eyes  H25.813       1,2. Retinal tears, OD - large HST tear located at 0115 -- s/p laser retinopexy (Dr. Rayvon Char. [place], Dec. 2021) -- anterior portion of pexy incomplete - smaller pigmented break at 0430 - s/p laser retinopexy OD, 04.14.22 -- good anterior fill in to 0115 break + good laser changes surrounding 0430 break - no new RT/RD - pt is cleared from a retina standpoint for release to Dr. Fabian SharpScott [facility] and resumption of primary eye care  3. PVD / vitreous syneresis OU  - OS with more recent onset of floaters (Feb 13 2021)  - Discussed findings and prognosis  - No RT or RD on 360 exam OS  - Reviewed s/s of RT/RD  - Strict return precautions for any such RT/RD signs/symptoms  4. Mixed Cataract OU - The symptoms of cataract, surgical options, and treatments and risks were discussed with patient. - discussed diagnosis and progression - under the expert  management of [facility]  Ophthalmic Meds Ordered this visit:  No orders of the defined types were placed in this encounter.     Return if symptoms worsen or fail to improve.  There are no Patient Instructions on file for this visit.  This document serves as a record of services personally performed by Karie ChimeraBrian G. Gioia Ranes, MD, PhD. It was created on their behalf by Glee ArvinAmanda J. Manson PasseyBrown, OA an ophthalmic technician. The creation of this record is the provider's dictation and/or activities during the visit.    Electronically signed by: Glee ArvinAmanda J. Kristopher OppenheimBrown, OA 08.01.2022 11:38 PM  Karie ChimeraBrian G. Naina Sleeper, M.D., Ph.D. Diseases & Surgery of the Retina and Vitreous [facility] 06/10/2021    I have reviewed the above documentation for accuracy and completeness, and I agree with the above. Karie ChimeraBrian G. Kim Oki, M.D., Ph.D. 06/11/21 11:38 PM  Abbreviations: M myopia (nearsighted); A astigmatism; H hyperopia (farsighted); P presbyopia; Mrx spectacle prescription;  CTL contact lenses; OD right eye; OS left eye; OU both eyes  XT exotropia; ET esotropia; PEK punctate epithelial keratitis; PEE punctate epithelial erosions; DES dry eye syndrome; MGD meibomian gland dysfunction; ATs artificial tears; PFAT's preservative free artificial tears; NSC nuclear sclerotic cataract; PSC posterior subcapsular cataract; ERM epi-retinal membrane; PVD posterior vitreous detachment; RD retinal detachment; DM diabetes mellitus; DR diabetic retinopathy; NPDR non-proliferative diabetic retinopathy; PDR proliferative diabetic retinopathy; CSME clinically significant macular edema; DME diabetic macular edema; dbh dot blot hemorrhages; CWS cotton wool spot; POAG primary open angle glaucoma; C/D cup-to-disc ratio; HVF humphrey visual  field; GVF goldmann visual field; OCT optical coherence tomography; IOP intraocular pressure; BRVO Branch retinal vein occlusion; CRVO central retinal vein occlusion; CRAO central retinal artery  occlusion; BRAO branch retinal artery occlusion; RT retinal tear; SB scleral buckle; PPV pars plana vitrectomy; VH Vitreous hemorrhage; PRP panretinal laser photocoagulation; IVK intravitreal kenalog; VMT vitreomacular traction; MH Macular hole;  NVD neovascularization of the disc; NVE neovascularization elsewhere; AREDS age related eye disease study; ARMD age related macular degeneration; POAG primary open angle glaucoma; EBMD epithelial/anterior basement membrane dystrophy; ACIOL anterior chamber intraocular lens; IOL intraocular lens; PCIOL posterior chamber intraocular lens; Phaco/IOL phacoemulsification with intraocular lens placement; PRK photorefractive keratectomy; LASIK laser assisted in situ keratomileusis; HTN hypertension; DM diabetes mellitus; COPD chronic obstructive pulmonary disease

## 2021-06-11 ENCOUNTER — Encounter (INDEPENDENT_AMBULATORY_CARE_PROVIDER_SITE_OTHER): Payer: Self-pay | Admitting: Ophthalmology

## 2021-06-24 ENCOUNTER — Other Ambulatory Visit: Payer: Self-pay | Admitting: *Deleted

## 2021-06-24 ENCOUNTER — Telehealth: Payer: Self-pay | Admitting: Allergy & Immunology

## 2021-06-24 MED ORDER — MONTELUKAST SODIUM 10 MG PO TABS: 10.0000 mg | ORAL_TABLET | Freq: Every day | ORAL | 1 refills | Status: DC

## 2021-06-24 NOTE — Telephone Encounter (Signed)
Patient is currently on montelukast, patient has been receiving a month's supply. Patient is requesting a 90 day prescription be sent in for her so that she does not have to make as many trips to pharmacy. [facility] - [address] [place], [place] [address]  Best contact number: [phone]

## 2021-06-24 NOTE — Telephone Encounter (Signed)
A 90 day prescription has been sent to the requested pharmacy. Called patient and left a detailed voicemail per DPR permission advising of medication being sent in.

## 2021-07-03 ENCOUNTER — Encounter: Payer: Self-pay | Admitting: Physician Assistant

## 2021-07-03 ENCOUNTER — Ambulatory Visit (INDEPENDENT_AMBULATORY_CARE_PROVIDER_SITE_OTHER): Payer: 59 | Admitting: Physician Assistant

## 2021-07-03 ENCOUNTER — Other Ambulatory Visit: Payer: Self-pay

## 2021-07-03 DIAGNOSIS — F411 Generalized anxiety disorder: Secondary | ICD-10-CM | POA: Diagnosis not present

## 2021-07-03 DIAGNOSIS — F329 Major depressive disorder, single episode, unspecified: Secondary | ICD-10-CM | POA: Diagnosis not present

## 2021-07-03 MED ORDER — BUPROPION HCL ER (XL) 300 MG PO TB24: ORAL_TABLET | ORAL | 1 refills | Status: DC

## 2021-07-03 MED ORDER — LORAZEPAM 0.5 MG PO TABS: 0.5000 mg | ORAL_TABLET | Freq: Three times a day (TID) | ORAL | 0 refills | Status: DC | PRN

## 2021-07-03 MED ORDER — CITALOPRAM HYDROBROMIDE 20 MG PO TABS: 30.0000 mg | ORAL_TABLET | Freq: Every day | ORAL | 1 refills | Status: DC

## 2021-07-03 NOTE — Progress Notes (Signed)
[facility] Med Check  Patient ID: Alexa Cohen,  MRN: [id number]  PCP: Estanislado Pandy, MD  Date of Evaluation: 07/03/2021 Time spent:20 minutes  Chief Complaint:  Chief Complaint   Anxiety; Follow-up; Depression     HISTORY/CURRENT STATUS: HPI for routine med check.  Feels like her meds are still working well. Patient denies loss of interest in usual activities and is able to enjoy things. Went to [place] over the summer and hiked. Had a great time. Denies decreased energy or motivation.  Appetite has not changed.  No extreme sadness, tearfulness, or feelings of hopelessness.  Denies any changes in concentration, making decisions or remembering things.  Denies suicidal or homicidal thoughts.  Anxiety is still an issue but she's able to deal with it more often than not. Rarely uses Ativan but it does help when she uses it. Did use it when flying recently.   Patient denies increased energy with decreased need for sleep, no increased talkativeness, no racing thoughts, no impulsivity or risky behaviors, no increased spending, no increased libido, no grandiosity, no increased irritability or anger, and no hallucinations.  Denies dizziness, syncope, seizures, numbness, tingling, tremor, tics, unsteady gait, slurred speech, confusion. Denies muscle or joint pain, stiffness, or dystonia.  Individual Medical History/ Review of Systems: Changes? :No    Past medications for mental health diagnoses include: Celexa, Wellbutrin XL, Ativan  Allergies: Patient has no known allergies.  Current Medications:  Current Outpatient Medications:    azelastine (ASTELIN) 0.1 % nasal spray, Place 2 sprays into both nostrils 2 (two) times daily., Disp: 30 mL, Rfl: 5   cetirizine (ZYRTEC) 10 MG tablet, Take 1 tablet (10 mg total) by mouth daily., Disp: 90 tablet, Rfl: 3   montelukast (SINGULAIR) 10 MG tablet, Take 1 tablet (10 mg total) by mouth at bedtime., Disp: 30 tablet, Rfl: 5   montelukast  (SINGULAIR) 10 MG tablet, Take 1 tablet (10 mg total) by mouth at bedtime., Disp: 90 tablet, Rfl: 1   acetaminophen (TYLENOL) 500 MG tablet, Take 500 mg by mouth every 6 (six) hours as needed. (Patient not taking: No sig reported), Disp: , Rfl:    buPROPion (WELLBUTRIN XL) 300 MG 24 hr tablet, TAKE 1 TABLET BY MOUTH ONCE DAILY IN THE MORNING, Disp: 90 tablet, Rfl: 1   citalopram (CELEXA) 20 MG tablet, Take 1.5 tablets (30 mg total) by mouth daily., Disp: 135 tablet, Rfl: 1   fluticasone (FLONASE) 50 MCG/ACT nasal spray, Place 1 spray into both nostrils daily as needed for allergies or rhinitis. (Patient not taking: No sig reported), Disp: , Rfl:    guaiFENesin (MUCINEX) 600 MG 12 hr tablet, Take 1,200 mg by mouth 2 (two) times daily as needed. (Patient not taking: No sig reported), Disp: , Rfl:    ibuprofen (ADVIL) 200 MG tablet, Take 200 mg by mouth every 6 (six) hours as needed. (Patient not taking: No sig reported), Disp: , Rfl:    LORazepam (ATIVAN) 0.5 MG tablet, Take 1 tablet (0.5 mg total) by mouth 3 (three) times daily as needed for anxiety., Disp: 30 tablet, Rfl: 0 Medication Side Effects: none  Family Medical/ Social History: Changes? See HPI. Brother is doing much better  MENTAL HEALTH EXAM:  Last menstrual period 10/06/2016.There is no height or weight on file to calculate BMI.  General Appearance: Casual, Neat and Well Groomed  Eye Contact:  Good  Speech:  Clear and Coherent and Normal Rate  Volume:  Normal  Mood:  Euthymic  Affect:  Appropriate  Thought Process:  Goal Directed and Descriptions of Associations: Circumstantial  Orientation:  Full (Time, Place, and Person)  Thought Content: Logical   Suicidal Thoughts:  No  Homicidal Thoughts:  No  Memory:  WNL  Judgement:  Good  Insight:  Good  Psychomotor Activity:  Normal  Concentration:  Concentration: Good and Attention Span: Good  Recall:  Good  Fund of Knowledge: Good  Language: Good  Assets:  Desire for  Improvement  ADL's:  Intact  Cognition: WNL  Prognosis:  Good    DIAGNOSES:    ICD-10-CM   1. 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 Generalized anxiety disorder  F41.1        Receiving Psychotherapy: No  [facility], [facility] in the past.   RECOMMENDATIONS:  PDMP was reviewed.  Last Ativan filled 06/14/2020.  No other controlled substances. I provided 20 minutes of face to face time during this encounter, including time spent before and after the visit in records review, medical decision making, and charting.  I am glad she is doing so well.  No medication changes are necessary. She really would like to decrease the Celexa at some point but fall and winter are her most difficult times mood wise so she prefers not to change now, and I agree. We discussed counseling.  She does not feel like she needs it now and Merlyn Albert May has retired.  I gave her the names of several counselors in our office and she can make an appointment with them if need be.  Or contact [facility] if she is not able to get in here quickly. Continue Ativan 0.5 mg, 1 po tid prn. She uses it extremely rarely. Continue Wellbutrin XL 300 mg 1 every morning. Continue Celexa 30 mg qd.  Return in 6 months.  Melony Overly, PA-C

## 2021-07-11 DIAGNOSIS — M810 Age-related osteoporosis without current pathological fracture: Secondary | ICD-10-CM | POA: Insufficient documentation

## 2021-09-20 ENCOUNTER — Ambulatory Visit: Payer: Self-pay | Admitting: Allergy & Immunology

## 2021-09-25 ENCOUNTER — Other Ambulatory Visit: Payer: Self-pay

## 2021-09-25 ENCOUNTER — Ambulatory Visit (INDEPENDENT_AMBULATORY_CARE_PROVIDER_SITE_OTHER): Payer: 59 | Admitting: Allergy & Immunology

## 2021-09-25 VITALS — BP 112/80 | HR 66 | Temp 97.8°F | Resp 18 | Ht 63.0 in | Wt 144.4 lb

## 2021-09-25 DIAGNOSIS — J302 Other seasonal allergic rhinitis: Secondary | ICD-10-CM | POA: Diagnosis not present

## 2021-09-25 DIAGNOSIS — T63481D Toxic effect of venom of other arthropod, accidental (unintentional), subsequent encounter: Secondary | ICD-10-CM

## 2021-09-25 DIAGNOSIS — J3089 Other allergic rhinitis: Secondary | ICD-10-CM

## 2021-09-25 MED ORDER — FLUTICASONE PROPIONATE 50 MCG/ACT NA SUSP: 1.0000 | Freq: Every day | NASAL | 3 refills | Status: AC | PRN

## 2021-09-25 MED ORDER — MONTELUKAST SODIUM 10 MG PO TABS: 10.0000 mg | ORAL_TABLET | Freq: Every day | ORAL | 3 refills | Status: DC

## 2021-09-25 MED ORDER — EPINEPHRINE 0.3 MG/0.3ML IJ SOAJ: 0.3000 mg | Freq: Once | INTRAMUSCULAR | 2 refills | Status: AC

## 2021-09-25 MED ORDER — EPINEPHRINE 0.3 MG/0.3ML IJ SOAJ
0.3000 mg | Freq: Once | INTRAMUSCULAR | 2 refills | Status: DC
Start: 2021-09-25 — End: 2021-09-25

## 2021-09-25 MED ORDER — CETIRIZINE HCL 10 MG PO TABS: 10.0000 mg | ORAL_TABLET | Freq: Every day | ORAL | 3 refills | Status: DC

## 2021-09-25 MED ORDER — AZELASTINE HCL 0.1 % NA SOLN: 2.0000 | Freq: Two times a day (BID) | NASAL | 2 refills | Status: DC

## 2021-09-25 NOTE — Patient Instructions (Addendum)
1. Seasonal and perennial allergic rhinitis (ragweed, weeds, trees, indoor molds, outdoor molds, dog and cockroach) - It seems that you have a good grasp on your medications. - I am very happy with how you are doing.  - Continue with: Flonase (fluticasone) two sprays per nostril daily and Zyrtec (cetirizine) 10mg  tablet in the morning combined with Singulair (montelukast) 10mg  daily and Astelin (azelastine) 2 sprays per nostril at night.   2. Stinging insect allergy - I sent in a refill to [facility]. - All of your scripts, I put "Hold script until needed".  3. Return in about 1 year (around 09/25/2022).   Please inform of any Emergency Department visits, hospitalizations, or changes in symptoms. Call 09/27/2022 before going to the ED for breathing or allergy symptoms since we might be able to fit you in for a sick visit. Feel free to contact us anytime with any questions, problems, or concerns.  It was a pleasure to see you again today!  Websites that have reliable patient information: 1. American Academy of Asthma, Allergy, and Immunology: www.aaaai.org 2. Food Allergy Research and Education (FARE): foodallergy.org 3. Mothers of Asthmatics: http://www.asthmacommunitynetwork.org 4. American College of Allergy, Asthma, and Immunology: www.acaai.org   COVID-19 Vaccine Information can be found at: [place] For questions related to vaccine distribution or appointments, please email vaccine@ [email or web address] or call [phone].     "Like" [email or web address] on Facebook and Instagram for our latest updates!     HAPPY FALL!     Make sure you are registered to vote! If you have moved or changed any of your contact information, you will need to get this updated before voting!  In some cases, you MAY be able to register to vote online: [phone]

## 2021-09-25 NOTE — Progress Notes (Signed)
FOLLOW UP  Date of Service/Encounter:  09/25/21   Assessment:   Seasonal and perennial allergic rhinitis (ragweed, weeds, trees, indoor molds, outdoor molds, dog and cockroach)   Stinging insect hypersensitivity   Plan/Recommendations:   1. Seasonal and perennial allergic rhinitis (ragweed, weeds, trees, indoor molds, outdoor molds, dog and cockroach) - It seems that you have a good grasp on your medications. - I am very happy with how you are doing.  - Continue with: Flonase (fluticasone) two sprays per nostril daily and Zyrtec (cetirizine) 10mg  tablet in the morning combined with Singulair (montelukast) 10mg  daily and Astelin (azelastine) 2 sprays per nostril at night.   2. Stinging insect allergy - I sent in a refill to [facility]. - All of your scripts, I put "Hold script until needed".  3. Return in about 1 year (around 09/25/2022).   Subjective:   Alexa Cohen is a 55 y.o. female presenting today for follow up of  Chief Complaint  Patient presents with   Follow-up    Patient in today for a follow up and has no new problems to report.    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  Alexa Cohen is a 55 y.o. female presenting for a follow up visit.  She was last seen in November 2021.  At that time, she was doing very well with Flonase as well as Zyrtec and Astelin.  She was also on Singulair.  Since the last visit, she has done very well.   Allergic Rhinitis Symptom History: She takes the cetirizine in the morning and the montelukast at night. She uses the azelastine at night but not EVERY night. She also has the fluticasone to use as needed. She has avoided her sinus infections and her symptoms have  overall been under good control.   She has been avoiding stinging insects. She not had a sting since then. She has an EpiPen. She has never had to use her EpiPen. She does do some outdoor activities including gardening in the summer. Her stinging insect allergy has not changed her lifestyle at all.  Otherwise, there have been no changes to her past medical history, surgical history, family history, or social history.    Review of Systems  Constitutional: Negative.  Negative for chills, fever, malaise/fatigue and weight loss.  HENT: Negative.  Negative for congestion, ear discharge, ear pain and sinus pain.   Eyes:  Negative for pain, discharge and redness.  Respiratory:  Negative for cough, sputum production, shortness of breath and wheezing.   Cardiovascular: Negative.  Negative for chest pain and palpitations.  Gastrointestinal:  Negative for abdominal pain, constipation, diarrhea, heartburn, nausea and vomiting.  Skin: Negative.  Negative for itching and rash.  Neurological:  Negative for dizziness and headaches.  Endo/Heme/Allergies:  Negative for environmental allergies. Does not bruise/bleed easily.      Objective:   Blood pressure 112/80, pulse 66, temperature 97.8 F (36.6 C), temperature source Temporal, resp. rate 18, height 5\' 3"  (1.6 m), weight 144 lb 6.4 oz (65.5 kg), last menstrual period 10/06/2016, SpO2 97 %. Body mass index is 25.58 kg/m.   Physical Exam:  Physical Exam Vitals reviewed.  Constitutional:      Appearance: She is well-developed.  HENT:     Head: Normocephalic and atraumatic.     Right Ear: Tympanic membrane, ear canal and external ear normal.     Left Ear: Tympanic membrane, ear canal and external ear normal.     Nose: No nasal deformity, septal deviation, mucosal edema or rhinorrhea.     Right Turbinates: Enlarged, swollen and pale.     Left Turbinates: Enlarged, swollen and pale.     Right Sinus: No maxillary sinus tenderness or frontal sinus  tenderness.     Left Sinus: No maxillary sinus tenderness or frontal sinus tenderness.     Mouth/Throat:     Mouth: Mucous membranes are not pale and not dry.     Pharynx: Uvula midline.  Eyes:     General: Lids are normal. No allergic shiner.       Right eye: No discharge.        Left eye: No discharge.     Conjunctiva/sclera: Conjunctivae normal.     Right eye: Right conjunctiva is not injected. No chemosis.    Left eye: Left conjunctiva is not injected. No chemosis.    Pupils: Pupils are equal, round, and reactive to light.  Cardiovascular:     Rate and Rhythm: Normal rate and regular rhythm.     Heart sounds: Normal heart sounds.  Pulmonary:     Effort: Pulmonary effort is normal. No tachypnea, accessory muscle usage or respiratory distress.     Breath sounds: Normal breath sounds. No wheezing, rhonchi or rales.  Chest:     Chest wall: No tenderness.  Lymphadenopathy:     Cervical: No cervical adenopathy.  Skin:    Coloration: Skin is not pale.     Findings: No abrasion, erythema, petechiae or rash. Rash is not papular, urticarial or vesicular.  Neurological:     Mental Status: She is alert.  Psychiatric:        Behavior: Behavior is cooperative.     Diagnostic studies: none       Malachi Bonds, MD  [facility]

## 2021-09-26 ENCOUNTER — Encounter: Payer: Self-pay | Admitting: Allergy & Immunology

## 2021-11-04 IMAGING — MG MM DIGITAL SCREENING BILAT W/ TOMO AND CAD
8 series · 8 of 24 positions shown · non-contrast
Comparison: Previous exam(s).

CLINICAL DATA: Screening.

EXAM:
DIGITAL SCREENING BILATERAL MAMMOGRAM WITH TOMOSYNTHESIS AND CAD
TECHNIQUE: Bilateral screening digital craniocaudal and mediolateral oblique
mammograms were obtained. Bilateral screening digital breast
tomosynthesis was performed. The images were evaluated with
computer-aided detection.

[L CC synth-2D]
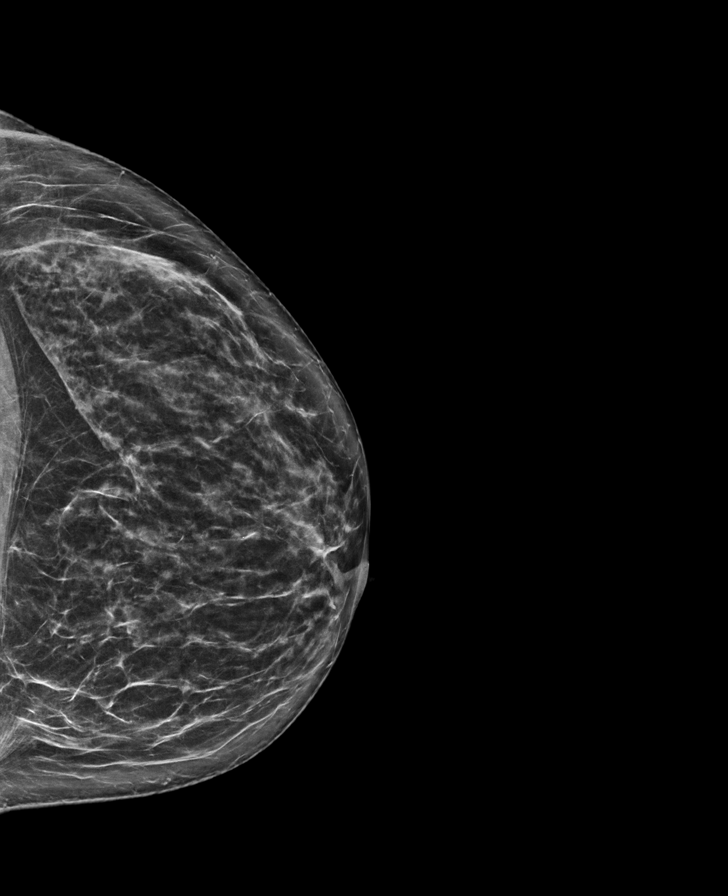

[R CC synth-2D]
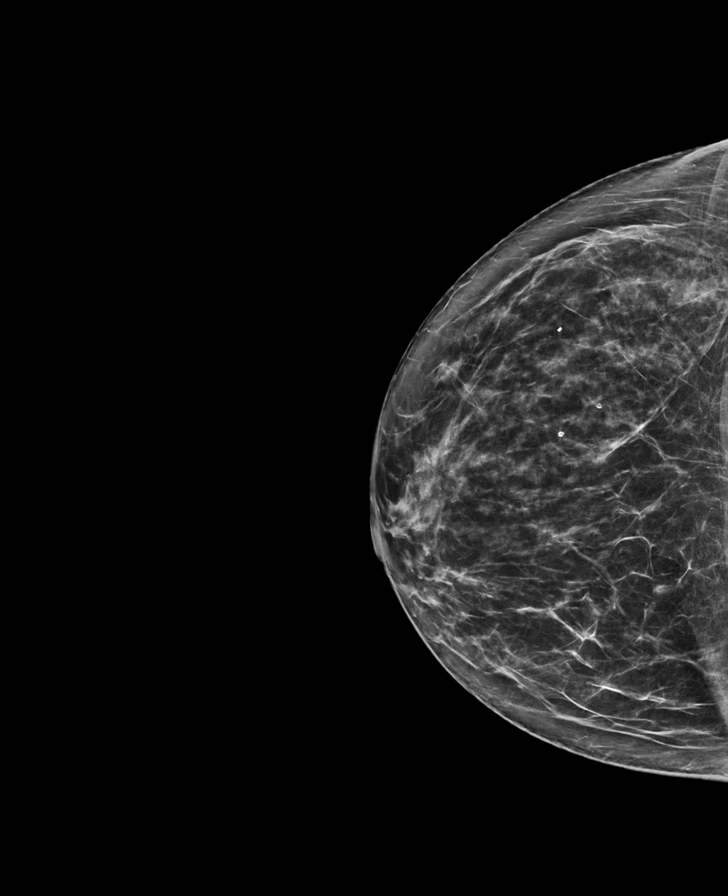

[L MLO synth-2D]
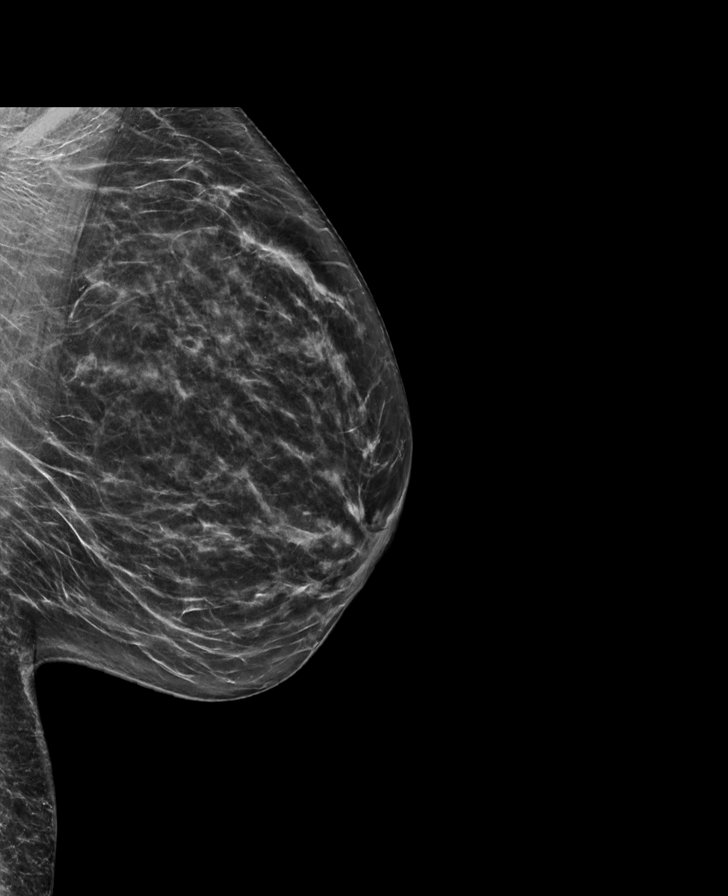

[R MLO synth-2D]
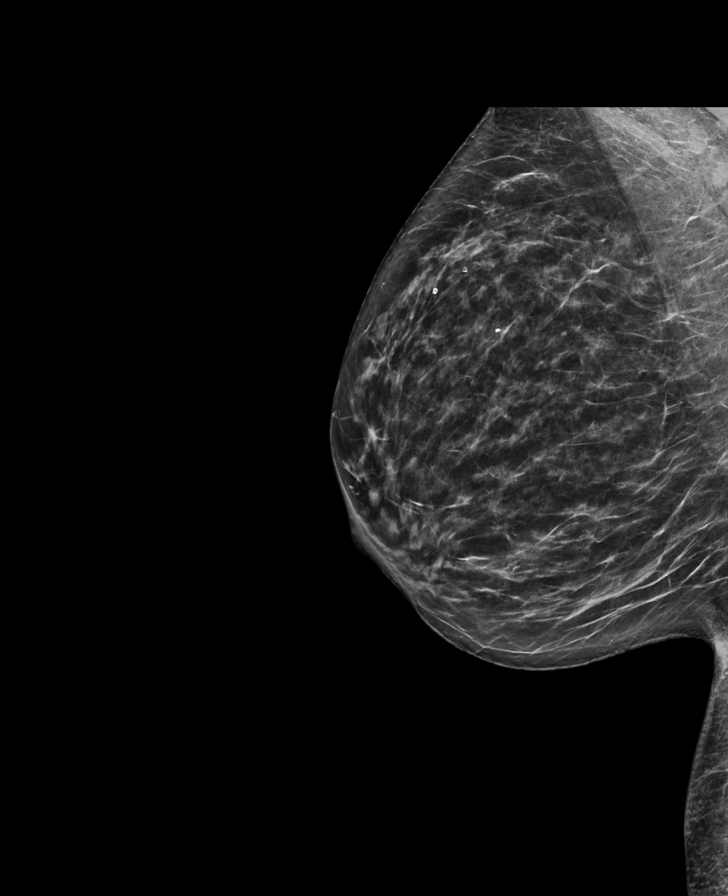

[R CC tomo · tomo slice 37/74.0]
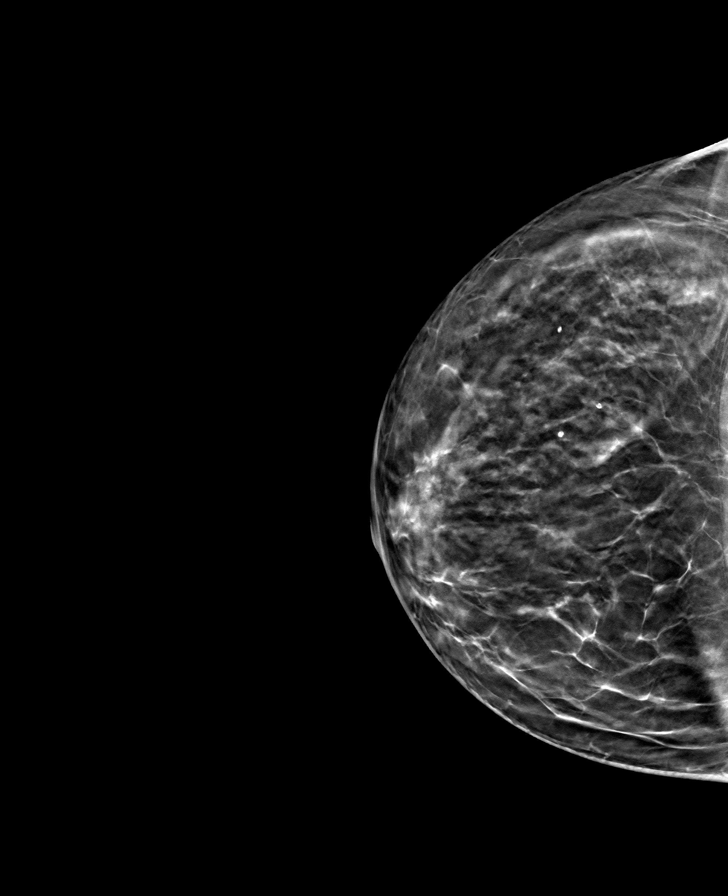

[L CC tomo · tomo slice 37/73.0]
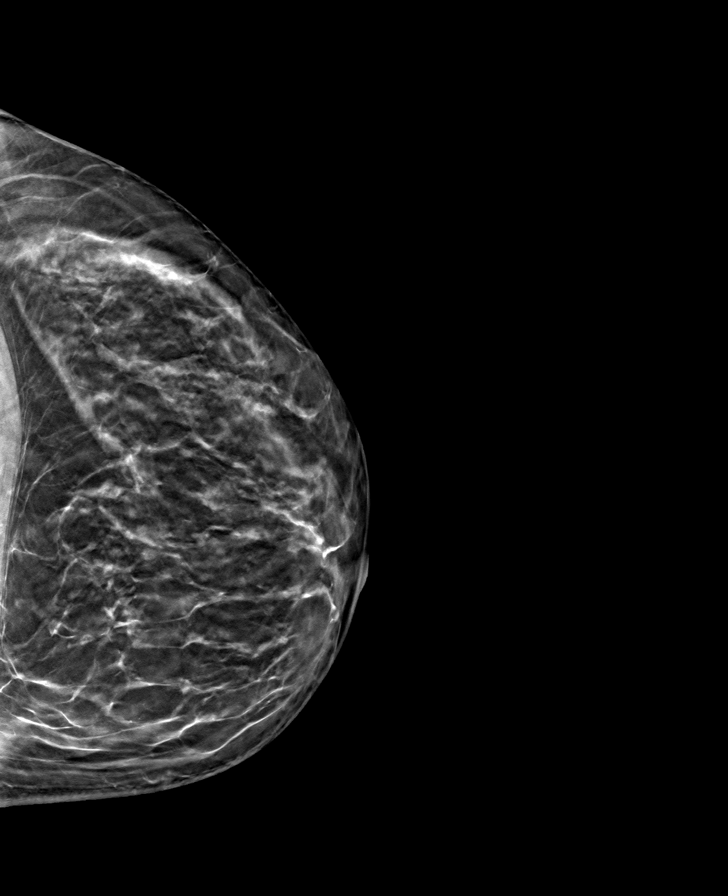

[R MLO tomo · tomo slice 35/69.0]
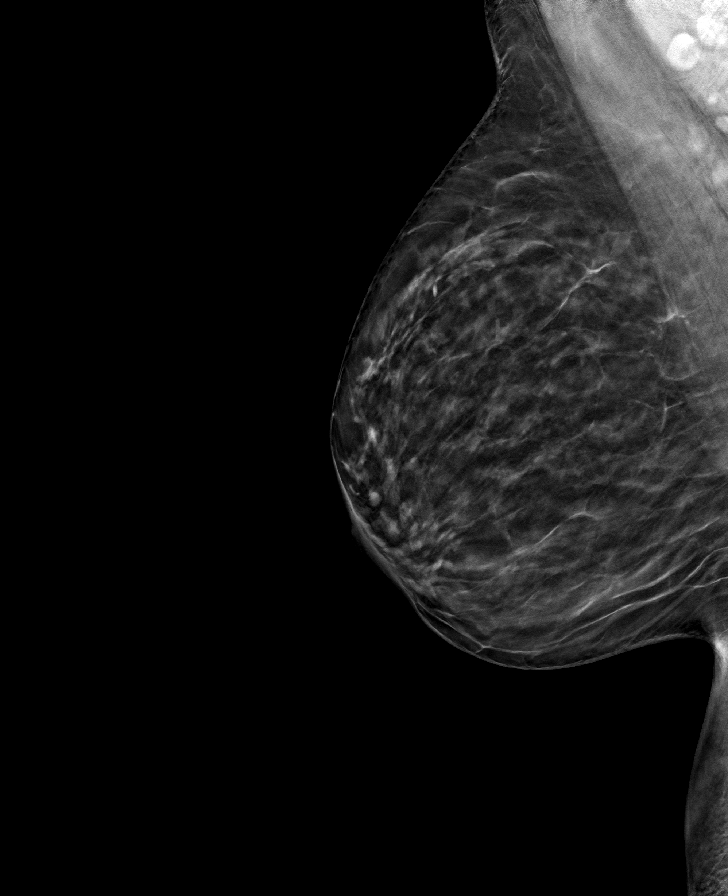

[L MLO tomo · tomo slice 36/71.0]
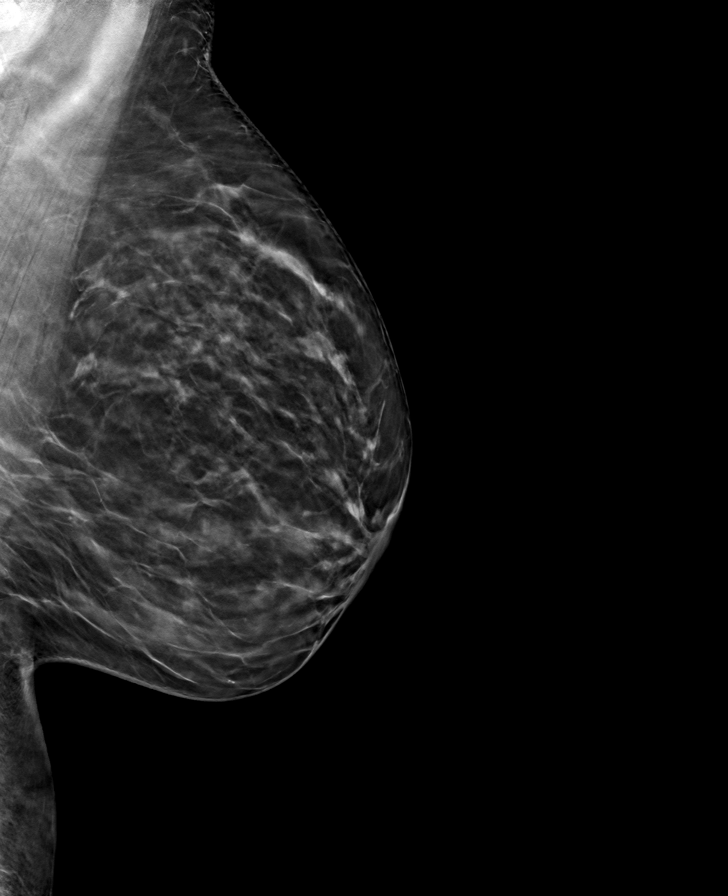

[8 of 24 positions shown; findings below may reference images not displayed]

ACR Breast Density Category c: The breast tissue is heterogeneously
dense, which may obscure small masses.
FINDINGS: There are no findings suspicious for malignancy. The images were
evaluated with computer-aided detection.
IMPRESSION: No mammographic evidence of malignancy. A result letter of this
screening mammogram will be mailed directly to the patient.

RECOMMENDATION:
Screening mammogram in one year. (Code:T4-5-GWO)

BI-RADS CATEGORY  1: Negative.

## 2021-12-19 ENCOUNTER — Other Ambulatory Visit: Payer: Self-pay | Admitting: Physician Assistant

## 2021-12-21 ENCOUNTER — Other Ambulatory Visit: Payer: Self-pay | Admitting: Allergy & Immunology

## 2022-01-03 ENCOUNTER — Telehealth (INDEPENDENT_AMBULATORY_CARE_PROVIDER_SITE_OTHER): Payer: 59 | Admitting: Physician Assistant

## 2022-01-03 ENCOUNTER — Encounter: Payer: Self-pay | Admitting: Physician Assistant

## 2022-01-03 DIAGNOSIS — F411 Generalized anxiety disorder: Secondary | ICD-10-CM

## 2022-01-03 DIAGNOSIS — F329 Major depressive disorder, single episode, unspecified: Secondary | ICD-10-CM

## 2022-01-03 MED ORDER — BUPROPION HCL ER (XL) 300 MG PO TB24: 300.0000 mg | ORAL_TABLET | Freq: Every morning | ORAL | 0 refills | Status: DC

## 2022-01-03 MED ORDER — CITALOPRAM HYDROBROMIDE 20 MG PO TABS: ORAL_TABLET | ORAL | 0 refills | Status: DC

## 2022-01-03 NOTE — Progress Notes (Signed)
[facility]  Patient ID: Alexa Cohen,  MRN: [id number]  PCP: Manon Hilding, MD  Date of Evaluation: 01/03/2022 Time spent:20 minutes  Chief Complaint:  Chief Complaint   Depression; Anxiety; Follow-up    Virtual Visit via Telehealth  I connected with patient by a video enabled telemedicine application with their informed consent, and verified patient privacy and that I am speaking with the correct person using two identifiers.  I am private, in my office and the patient is at home.  I discussed the limitations, risks, security and privacy concerns of performing an evaluation and management service by video and the availability of in person appointments. I also discussed with the patient that there may be a patient responsible charge related to this service. The patient expressed understanding and agreed to proceed.   I discussed the assessment and treatment plan with the patient. The patient was provided an opportunity to ask questions and all were answered. The patient agreed with the plan and demonstrated an understanding of the instructions.   The patient was advised to call back or seek an in-person evaluation if the symptoms worsen or if the condition fails to improve as anticipated.  I provided 20  minutes of non-face-to-face time during this encounter.    HISTORY/CURRENT STATUS: HPI for routine med check.  A lot of stressors right now. Husband just had hip replacement earlier this week, she is teaching, helping someone out for tax season, changing churches, building a house, has a lot going on and sometimes feels overwhelmed.  But overall she is doing well.  Feels like the Wellbutrin and Celexa are working.  She has still wanted to decrease the Celexa at some point but does not feel that now is the time.   Patient denies loss of interest in usual activities and is able to enjoy things.  Denies decreased energy or motivation.  Appetite has not changed.  No  extreme sadness, tearfulness, or feelings of hopelessness.  Denies any changes in concentration, making decisions or remembering things.  Sleeps okay.  Personal hygiene and ADLs are within normal limits.  She does get overwhelmed at times and really anxious but has not taken the Ativan in more than 6 months probably.  Denies suicidal or homicidal thoughts.  Patient denies increased energy with decreased need for sleep, no increased talkativeness, no racing thoughts, no impulsivity or risky behaviors, no increased spending, no increased libido, no grandiosity, no increased irritability or anger, and no hallucinations.  Denies dizziness, syncope, seizures, numbness, tingling, tremor, tics, unsteady gait, slurred speech, confusion. Denies muscle or joint pain, stiffness, or dystonia.  Individual Medical History/ Review of Systems: Changes? :No    Past medications for mental health diagnoses include: Celexa, Wellbutrin XL, Ativan  Allergies: Patient has no known allergies.  Current Medications:  Current Outpatient Medications:    Azelastine HCl 137 MCG/SPRAY SOLN, USE 2 SPRAY(S) IN EACH NOSTRIL TWICE DAILY, Disp: 30 mL, Rfl: 5   guaiFENesin (MUCINEX) 600 MG 12 hr tablet, Take 1,200 mg by mouth 2 (two) times daily as needed., Disp: , Rfl:    ibuprofen (ADVIL) 200 MG tablet, Take 200 mg by mouth every 6 (six) hours as needed., Disp: , Rfl:    LORazepam (ATIVAN) 0.5 MG tablet, Take 1 tablet (0.5 mg total) by mouth 3 (three) times daily as needed for anxiety., Disp: 30 tablet, Rfl: 0   buPROPion (WELLBUTRIN XL) 300 MG 24 hr tablet, Take 1 tablet (300 mg total) by mouth every  morning., Disp: 90 tablet, Rfl: 0   cetirizine (ZYRTEC) 10 MG tablet, Take 1 tablet (10 mg total) by mouth daily., Disp: 90 tablet, Rfl: 3   citalopram (CELEXA) 20 MG tablet, TAKE 1 & 1/2 (ONE & ONE-HALF) TABLETS BY MOUTH ONCE DAILY, Disp: 135 tablet, Rfl: 0   fluticasone (FLONASE) 50 MCG/ACT nasal spray, Place 1 spray into both  nostrils daily as needed for allergies or rhinitis., Disp: 48 mL, Rfl: 3   montelukast (SINGULAIR) 10 MG tablet, Take 1 tablet (10 mg total) by mouth at bedtime., Disp: 90 tablet, Rfl: 3 Medication Side Effects: none  Family Medical/ Social History: Changes?  See HPI   MENTAL HEALTH EXAM:  Last menstrual period 10/06/2016.There is no height or weight on file to calculate BMI.  General Appearance: Casual, Neat and Well Groomed  Eye Contact:  Good  Speech:  Clear and Coherent and Normal Rate  Volume:  Normal  Mood:  Euthymic  Affect:  Appropriate  Thought Process:  Goal Directed and Descriptions of Associations: Circumstantial  Orientation:  Full (Time, Place, and Person)  Thought Content: Logical   Suicidal Thoughts:  No  Homicidal Thoughts:  No  Memory:  WNL  Judgement:  Good  Insight:  Good  Psychomotor Activity:  Normal  Concentration:  Concentration: Good and Attention Span: Good  Recall:  Good  Fund of Knowledge: Good  Language: Good  Assets:  Desire for Improvement  ADL's:  Intact  Cognition: WNL  Prognosis:  Good    DIAGNOSES:    ICD-10-CM   1. Generalized anxiety disorder  F41.1     2. 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   RECOMMENDATIONS:  PDMP was reviewed.  Last Ativan filled 07/03/2021.  I provided 20 minutes of non-face-to-face time during this encounter, including time spent before and after the visit in records review, medical decision making, counseling pertinent to today's visit, and charting.  No changes in meds necessary.  Continue Ativan 0.5 mg, 1 po tid prn. She uses it extremely rarely. Continue Wellbutrin XL 300 mg 1 every morning. Continue Celexa 30 mg qd.  Return in 6 months.  Donnal Moat, PA-C

## 2022-03-05 ENCOUNTER — Ambulatory Visit (INDEPENDENT_AMBULATORY_CARE_PROVIDER_SITE_OTHER): Payer: Self-pay | Admitting: Allergy & Immunology

## 2022-03-05 ENCOUNTER — Encounter: Payer: Self-pay | Admitting: Allergy & Immunology

## 2022-03-05 DIAGNOSIS — J0100 Acute maxillary sinusitis, unspecified: Secondary | ICD-10-CM

## 2022-03-05 DIAGNOSIS — T63481D Toxic effect of venom of other arthropod, accidental (unintentional), subsequent encounter: Secondary | ICD-10-CM

## 2022-03-05 DIAGNOSIS — J3089 Other allergic rhinitis: Secondary | ICD-10-CM

## 2022-03-05 DIAGNOSIS — J302 Other seasonal allergic rhinitis: Secondary | ICD-10-CM

## 2022-03-05 MED ORDER — PREDNISONE 10 MG PO TABS: ORAL_TABLET | ORAL | 0 refills | Status: DC

## 2022-03-05 NOTE — Progress Notes (Signed)
? ?RE: Alexa Cohen MRN: [id number] DOB: Oct 12, 1966 ?Date of Telemedicine Visit: 03/05/2022 ? ?Referring provider: Estanislado Pandy, MD ?Primary care provider: Estanislado Pandy, MD ? ?Chief Complaint: Cough and Nasal Congestion ? ? ?Telemedicine Follow Up Visit via Telephone: ?I connected with Alexa Cohen for a follow up on 03/05/22 by telephone and verified that I am speaking with the correct person using two identifiers. ?  ?I discussed the limitations, risks, security and privacy concerns of performing an evaluation and management service by telephone and the availability of in person appointments. I also discussed with the patient that there may be a patient responsible charge related to this service. The patient expressed understanding and agreed to proceed. ? ?Patient is at work.  ?Provider is at the office.  ?Visit start time: 10:50 AM ?Visit end time: 11:11 AM ?Insurance consent/check in by: Albin Felling ?Medical consent and medical assistant/nurse: Dr. Reece Agar ? ?History of Present Illness: ? ?She is a 56 y.o. female, who is being followed for environmental allergies. She was last seen in November 2022.  At that time, she seemed to have a good grasp on her medications.  She was using Flonase 2 sprays per nostril daily as well as Zyrtec in the morning and Singulair at night.  She was also on Astelin 2 sprays per nostril daily.  We did refill her EpiPen for her history of stinging insect allergy. ? ?In the interim, she has mostly done well.  However, she reports that she has had a couple of weeks of worsening congestion and sneezing.  She reports that she has been using Flonase in the morning and Astelin at night as recommended.  She also has been using her antihistamine.  She has had no fever but continues to have a lot of clear discharge.  COVID testing has been negative. ? ?She has a concert this weekend. 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  She wants to try to get better before this.  She  has not needed antibiotics or prednisone in quite some time.  Just in her environment.  She is unsure of the trigger, but thinks it is related to allergies. ? ?They are going to be moving to [place].  They purchased some land near [address].  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  She is planning a garden this year since they are not moving till the end of fall, but her husband is stopped working on his tree house.  She said that he might build a tree house at their new location as well. ? ?Otherwise, there have been no changes to her past medical history, surgical history, family history, or social history. ? ?Assessment and Plan: ? ?Alexa Cohen is a 56 y.o. female with: ? ?Seasonal and perennial allergic rhinitis ? ?Acute non-recurrent maxillary sinusitis ? ?Insect sting allergy ? ? ?We are going to start a prednisone burst to see if this will get ahead of her symptoms.  We can do an antibiotic as well, but she feels more comfortable trying the prednisone to see if that helps first.  She has not needed it in quite some time and does not get it often at all.  She is get a call us on Friday with an update.  Otherwise, she is going to continue with all of her other medications. ? ?Diagnostics: ?None. ? ?Medication List:  ?Current Outpatient Medications  ?Medication Sig Dispense Refill  ? predniSONE (  DELTASONE) 10 MG tablet Take two tablets (20mg ) twice daily for three days, then one tablet (10mg ) twice daily for three days, then STOP. 18 tablet 0  ? Azelastine HCl 137 MCG/SPRAY SOLN USE 2 SPRAY(S) IN EACH NOSTRIL TWICE DAILY 30 mL 5  ? buPROPion (WELLBUTRIN XL) 300 MG 24 hr tablet Take 1 tablet (300 mg total) by mouth every morning. 90 tablet 0  ? cetirizine (ZYRTEC) 10 MG tablet Take 1 tablet (10 mg total) by mouth daily. 90 tablet 3  ? citalopram (CELEXA) 20 MG tablet TAKE 1 & 1/2 (ONE & ONE-HALF) TABLETS BY MOUTH ONCE DAILY 135 tablet 0  ? fluticasone (FLONASE) 50 MCG/ACT  nasal spray Place 1 spray into both nostrils daily as needed for allergies or rhinitis. 48 mL 3  ? guaiFENesin (MUCINEX) 600 MG 12 hr tablet Take 1,200 mg by mouth 2 (two) times daily as needed.    ? ibuprofen (ADVIL) 200 MG tablet Take 200 mg by mouth every 6 (six) hours as needed.    ? LORazepam (ATIVAN) 0.5 MG tablet Take 1 tablet (0.5 mg total) by mouth 3 (three) times daily as needed for anxiety. 30 tablet 0  ? montelukast (SINGULAIR) 10 MG tablet Take 1 tablet (10 mg total) by mouth at bedtime. 90 tablet 3  ? ?No current facility-administered medications for this visit.  ? ?Allergies: ?No Known Allergies ?I reviewed her past medical history, social history, family history, and environmental history and no significant changes have been reported from previous visits. ? ?Review of Systems  ?Constitutional:  Negative for activity change and appetite change.  ?HENT:  Positive for rhinorrhea, sinus pressure and sore throat. Negative for congestion and postnasal drip.   ?Eyes:  Negative for pain, discharge, redness and itching.  ?Respiratory:  Negative for shortness of breath, wheezing and stridor.   ?Gastrointestinal:  Negative for diarrhea, nausea and vomiting.  ?Endocrine: Negative for cold intolerance and heat intolerance.  ?Musculoskeletal:  Negative for arthralgias, joint swelling and myalgias.  ?Skin:  Negative for rash.  ?Allergic/Immunologic: Negative for environmental allergies and food allergies.  ? ?Objective: ? ?Physical exam not obtained as encounter was done via telephone.  ? ?Previous notes and tests were reviewed. ? ?I discussed the assessment and treatment plan with the patient. The patient was provided an opportunity to ask questions and all were answered. The patient agreed with the plan and demonstrated an understanding of the instructions. ?  ?The patient was advised to call back or seek an in-person evaluation if the symptoms worsen or if the condition fails to improve as anticipated. ? ?I  provided 21 minutes of non-face-to-face time during this encounter. ? ?It was my pleasure to participate in Alexa Cohen's care today. Please feel free to contact me with any questions or concerns.  ? ?Sincerely, ? ? , MD ?

## 2022-03-14 ENCOUNTER — Other Ambulatory Visit: Payer: Self-pay | Admitting: Obstetrics and Gynecology

## 2022-03-14 DIAGNOSIS — Z1231 Encounter for screening mammogram for malignant neoplasm of breast: Secondary | ICD-10-CM

## 2022-03-31 ENCOUNTER — Other Ambulatory Visit: Payer: Self-pay | Admitting: Physician Assistant

## 2022-04-17 ENCOUNTER — Ambulatory Visit: Payer: Self-pay

## 2022-05-28 ENCOUNTER — Other Ambulatory Visit: Payer: Self-pay | Admitting: Obstetrics and Gynecology

## 2022-05-28 DIAGNOSIS — R928 Other abnormal and inconclusive findings on diagnostic imaging of breast: Secondary | ICD-10-CM

## 2022-06-05 ENCOUNTER — Ambulatory Visit
Admission: RE | Admit: 2022-06-05 | Discharge: 2022-06-05 | Disposition: A | Discharge status: Home / Self Care | Payer: No Typology Code available for payment source | Source: Ambulatory Visit | Attending: Obstetrics and Gynecology | Admitting: Obstetrics and Gynecology

## 2022-06-05 ENCOUNTER — Other Ambulatory Visit: Payer: Self-pay | Admitting: Obstetrics and Gynecology

## 2022-06-05 DIAGNOSIS — N6310 Unspecified lump in the right breast, unspecified quadrant: Secondary | ICD-10-CM

## 2022-06-05 DIAGNOSIS — R928 Other abnormal and inconclusive findings on diagnostic imaging of breast: Secondary | ICD-10-CM

## 2022-06-05 DIAGNOSIS — N6320 Unspecified lump in the left breast, unspecified quadrant: Secondary | ICD-10-CM

## 2022-06-19 ENCOUNTER — Other Ambulatory Visit: Payer: Self-pay | Admitting: Physician Assistant

## 2022-07-18 ENCOUNTER — Ambulatory Visit: Payer: Self-pay | Admitting: Physician Assistant

## 2022-07-23 ENCOUNTER — Ambulatory Visit: Payer: Self-pay | Admitting: Physician Assistant

## 2022-07-24 ENCOUNTER — Encounter: Payer: Self-pay | Admitting: Physician Assistant

## 2022-07-24 ENCOUNTER — Ambulatory Visit (INDEPENDENT_AMBULATORY_CARE_PROVIDER_SITE_OTHER): Payer: Self-pay | Admitting: Physician Assistant

## 2022-07-24 DIAGNOSIS — F411 Generalized anxiety disorder: Secondary | ICD-10-CM

## 2022-07-24 DIAGNOSIS — F329 Major depressive disorder, single episode, unspecified: Secondary | ICD-10-CM

## 2022-07-24 MED ORDER — CITALOPRAM HYDROBROMIDE 20 MG PO TABS
30.0000 mg | ORAL_TABLET | Freq: Every day | ORAL | 1 refills | Status: DC
Start: 2022-07-24 — End: 2023-01-22

## 2022-07-24 MED ORDER — BUPROPION HCL ER (XL) 300 MG PO TB24: 300.0000 mg | ORAL_TABLET | Freq: Every morning | ORAL | 1 refills | Status: DC

## 2022-07-24 NOTE — Progress Notes (Signed)
[facility]  Patient ID: Alexa Cohen,  MRN: [id number]  PCP: Estanislado Pandy, MD  Date of Evaluation: 07/24/2022 Time spent:20 minutes  Chief Complaint:  Chief Complaint   Anxiety; Depression; Follow-up    HISTORY/CURRENT STATUS: HPI for routine med check.  Is building a house, her husband is having shots in his back, so that's stressful. Still teaching music one day a week.   Patient is able to enjoy things.  Energy and motivation are good.  No extreme sadness, tearfulness, or feelings of hopelessness.  Sleeps well most of the time. ADLs and personal hygiene are normal.   Denies any changes in concentration, making decisions, or remembering things.  Appetite has not changed.  Weight is stable.   Denies suicidal or homicidal thoughts.  Anxiety is a problem sometimes.  She has taken probably 3 Ativan since her last appointment with me.  Her brother sounds like he has schizoaffective disorder and that causes her a lot of stress.  He refuses to take his meds that he has been prescribed.  So when she feels overwhelmed then she will take the Ativan and it is helpful.       Patient denies increased energy with decreased need for sleep, increased talkativeness, racing thoughts, impulsivity or risky behaviors, increased spending, increased libido, grandiosity, increased irritability or anger, paranoia, or hallucinations.  Denies dizziness, syncope, seizures, numbness, tingling, tremor, tics, unsteady gait, slurred speech, confusion. Denies muscle or joint pain, stiffness, or dystonia.  Individual Medical History/ Review of Systems: Changes? :No    Past medications for mental health diagnoses include: Celexa, Wellbutrin XL, Ativan  Allergies: Patient has no known allergies.  Current Medications:  Current Outpatient Medications:    Azelastine HCl 137 MCG/SPRAY SOLN, USE 2 SPRAY(S) IN EACH NOSTRIL TWICE DAILY, Disp: 30 mL, Rfl: 5   guaiFENesin (MUCINEX) 600 MG 12 hr tablet,  Take 1,200 mg by mouth 2 (two) times daily as needed., Disp: , Rfl:    ibuprofen (ADVIL) 200 MG tablet, Take 200 mg by mouth every 6 (six) hours as needed., Disp: , Rfl:    LORazepam (ATIVAN) 0.5 MG tablet, Take 1 tablet (0.5 mg total) by mouth 3 (three) times daily as needed for anxiety., Disp: 30 tablet, Rfl: 0   buPROPion (WELLBUTRIN XL) 300 MG 24 hr tablet, Take 1 tablet (300 mg total) by mouth every morning., Disp: 90 tablet, Rfl: 1   cetirizine (ZYRTEC) 10 MG tablet, Take 1 tablet (10 mg total) by mouth daily., Disp: 90 tablet, Rfl: 3   citalopram (CELEXA) 20 MG tablet, Take 1.5 tablets (30 mg total) by mouth daily., Disp: 135 tablet, Rfl: 1   fluticasone (FLONASE) 50 MCG/ACT nasal spray, Place 1 spray into both nostrils daily as needed for allergies or rhinitis., Disp: 48 mL, Rfl: 3   montelukast (SINGULAIR) 10 MG tablet, Take 1 tablet (10 mg total) by mouth at bedtime., Disp: 90 tablet, Rfl: 3 Medication Side Effects: none  Family Medical/ Social History: Changes?  See HPI   MENTAL HEALTH EXAM:  Last menstrual period 10/06/2016.There is no height or weight on file to calculate BMI.  General Appearance: Casual, Neat and Well Groomed  Eye Contact:  Good  Speech:  Clear and Coherent and Normal Rate  Volume:  Normal  Mood:  Euthymic  Affect:  Appropriate  Thought Process:  Goal Directed and Descriptions of Associations: Circumstantial  Orientation:  Full (Time, Place, and Person)  Thought Content: Logical   Suicidal Thoughts:  No  Homicidal Thoughts:  No  Memory:  WNL  Judgement:  Good  Insight:  Good  Psychomotor Activity:  Normal  Concentration:  Concentration: Good and Attention Span: Good  Recall:  Good  Fund of Knowledge: Good  Language: Good  Assets:  Desire for Improvement Financial Resources/Insurance Housing Transportation Vocational/Educational  ADL's:  Intact  Cognition: WNL  Prognosis:  Good   DIAGNOSES:    ICD-10-CM   1. Generalized anxiety disorder   F41.1     2. 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   RECOMMENDATIONS:  PDMP was reviewed.  Last Ativan filled 07/03/2021.  I provided 20 minutes of face to face time during this encounter, including time spent before and after the visit in records review, medical decision making, counseling pertinent to today's visit, and charting.   She is doing well with her mental health medications so no changes will be made.  Continue Wellbutrin XL 300 mg 1 every morning. Continue Celexa 30 mg qd.  Continue Ativan 0.5 mg, 1 p.o. 3 times daily as needed. Return in 6 months.  Melony Overly, PA-C

## 2022-09-26 ENCOUNTER — Encounter: Payer: Self-pay | Admitting: Allergy & Immunology

## 2022-09-26 ENCOUNTER — Ambulatory Visit: Payer: Self-pay | Admitting: Allergy & Immunology

## 2022-09-26 VITALS — BP 122/66 | HR 83 | Temp 97.6°F | Resp 16 | Ht 63.75 in | Wt 147.1 lb

## 2022-09-26 DIAGNOSIS — J302 Other seasonal allergic rhinitis: Secondary | ICD-10-CM

## 2022-09-26 DIAGNOSIS — T63481D Toxic effect of venom of other arthropod, accidental (unintentional), subsequent encounter: Secondary | ICD-10-CM

## 2022-09-26 DIAGNOSIS — J3089 Other allergic rhinitis: Secondary | ICD-10-CM

## 2022-09-26 MED ORDER — MONTELUKAST SODIUM 10 MG PO TABS: 10.0000 mg | ORAL_TABLET | Freq: Every day | ORAL | 3 refills | Status: DC

## 2022-09-26 MED ORDER — CETIRIZINE HCL 10 MG PO TABS
10.0000 mg | ORAL_TABLET | Freq: Every day | ORAL | 3 refills | Status: DC
Start: 2022-09-26 — End: 2024-03-09

## 2022-09-26 MED ORDER — AZELASTINE HCL 137 MCG/SPRAY NA SOLN: 2.0000 | Freq: Two times a day (BID) | NASAL | 3 refills | Status: DC | PRN

## 2022-09-26 MED ORDER — EPINEPHRINE 0.3 MG/0.3ML IJ SOAJ: 0.3000 mg | Freq: Once | INTRAMUSCULAR | 1 refills | Status: AC

## 2022-09-26 NOTE — Progress Notes (Signed)
FOLLOW UP  Date of Service/Encounter:  09/26/22   Assessment:   Seasonal and perennial allergic rhinitis (ragweed, weeds, trees, indoor molds, outdoor molds, dog and cockroach)   Insect sting allergy  Alexa Cohen with the [employer]     Plan/Recommendations:   1. Seasonal and perennial allergic rhinitis (ragweed, weeds, trees, indoor molds, outdoor molds, dog and cockroach) - It seems that you have a good grasp on your medications. - I am very happy with how you are doing!  - Continue with: Flonase (fluticasone) two sprays per nostril daily and Zyrtec (cetirizine) 10mg  tablet in the morning combined with Singulair (montelukast) 10mg  daily and Astelin (azelastine) 2 sprays per nostril at night.  - Try using the nose sprays consistently through the holiday season to avoid needing any antibiotics.   2. Stinging insect allergy - [facility].  - All of your scripts, I put "Hold script until needed".  3. Return in about 1 year (around 09/27/2023).   Subjective:   Alexa Cohen is a 56 y.o. female presenting today for follow up of  Chief Complaint  Patient presents with   Allergic Rhinitis     Takes cetrizine in the morning and singular at night. Uses nasal sprays as needed.     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  Alexa Cohen is a 56 y.o. female presenting for a follow up visit. She was last seen in April 2023 via televisit. At that time, we started her on a prednisone burst to see if this will help with the sinus symptoms that she was experiencing. We told her that we could do an antibiotic if needed, but I was hopeful that the prednisone would do the trick.   Since the last  visit, she has done well. 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   Allergic Rhinitis Symptom History: She remains on the cetirizine in the morning and montelukast at night. She is using the Astelin and Flonase more on a PRN basis. She tends to get sick at Christmas, but maybe not.  She is overall doing well and is able to manager her symptoms with the use of nasal sprays on a PRN basis.   She has not had any stings at all. She has an EpiPen, but she is not sure if it is out of date or not. She would like one sent to the pharmacy to keep on bold.   Her new place is almost going to be able to move in by the first of the year. They are putting in cabinets after Thanksgiving.   Her son is going to be working in 06-02-1972. [employer] but they are going to live in [place]. They are moving back from [place]. Her daughter in law is going to be working as a RD and [place]. She has training in performing arts and she is working in [place] right now.   Her daughter is a Paediatric nurse who works at [employer].  Otherwise, there have been no changes to her past medical history, surgical history, family history, or social history.   Review of Systems  Constitutional: Negative.  Negative for chills, fever, malaise/fatigue and weight loss.  HENT: Negative.  Negative for congestion, ear discharge, ear pain and sinus pain.   Eyes:  Negative for pain, discharge and redness.  Respiratory:  Negative for cough, sputum production, shortness of breath and wheezing.   Cardiovascular: Negative.  Negative for chest pain and palpitations.  Gastrointestinal:  Negative for abdominal pain, constipation, diarrhea, heartburn, nausea and vomiting.  Skin: Negative.  Negative for itching and rash.  Neurological:  Negative for dizziness and headaches.  Endo/Heme/Allergies:  Negative for environmental allergies. Does not bruise/bleed easily.        Objective:   Blood pressure 122/66, pulse 83, temperature 97.6 F (36.4 C), resp. rate 16, height 5' 3.75" (1.619 m), weight 147 lb 2 oz (66.7 kg), last menstrual period 10/06/2016, SpO2 96 %. Body mass index is 25.45 kg/m.    Physical Exam Vitals reviewed.  Constitutional:      Appearance: She is well-developed.  HENT:     Head: Normocephalic and atraumatic.     Right Ear: Tympanic membrane, ear canal and external ear normal.     Left Ear: Tympanic membrane, ear canal and external ear normal.     Nose: No nasal deformity, septal deviation, mucosal edema or rhinorrhea.     Right Turbinates: Enlarged, swollen and pale.     Left Turbinates: Enlarged, swollen and pale.     Right Sinus: No maxillary sinus tenderness or frontal sinus tenderness.     Left Sinus: No maxillary sinus tenderness or frontal sinus tenderness.     Mouth/Throat:     Lips: Pink.     Mouth: Mucous membranes are moist. Mucous membranes are not pale and not dry.     Pharynx: Uvula midline.  Eyes:     General: Lids are normal. No allergic shiner.       Right eye: No discharge.        Left eye: No discharge.     Conjunctiva/sclera: Conjunctivae normal.     Right eye: Right conjunctiva is not injected. No chemosis.    Left eye: Left conjunctiva is not injected. No chemosis.    Pupils: Pupils are equal, round, and reactive to light.  Cardiovascular:     Rate and Rhythm: Normal rate and regular rhythm.     Heart sounds: Normal heart sounds.  Pulmonary:     Effort: Pulmonary effort is normal. No tachypnea, accessory muscle usage or respiratory distress.     Breath sounds: Normal breath sounds. No wheezing, rhonchi or rales.  Chest:     Chest wall: No tenderness.  Lymphadenopathy:     Cervical: No cervical adenopathy.  Skin:    Coloration: Skin is not pale.     Findings: No abrasion, erythema, petechiae or rash. Rash is not papular, urticarial or vesicular.  Neurological:     Mental Status: She is  alert.  Psychiatric:        Behavior: Behavior is cooperative.      Diagnostic studies: none      Salvatore Marvel, MD  [facility]

## 2022-09-26 NOTE — Patient Instructions (Addendum)
1. Seasonal and perennial allergic rhinitis (ragweed, weeds, trees, indoor molds, outdoor molds, dog and cockroach) - It seems that you have a good grasp on your medications. - I am very happy with how you are doing!  - Continue with: Flonase (fluticasone) two sprays per nostril daily and Zyrtec (cetirizine) 10mg  tablet in the morning combined with Singulair (montelukast) 10mg  daily and Astelin (azelastine) 2 sprays per nostril at night.  - Try using the nose sprays consistently through the holiday season to avoid needing any antibiotics.   2. Stinging insect allergy - [facility]  - All of your scripts, I put "Hold script until needed".  3. Return in about 1 year (around 09/27/2023).   Please inform of any Emergency Department visits, hospitalizations, or changes in symptoms. Call 09/29/2023 before going to the ED for breathing or allergy symptoms since we might be able to fit you in for a sick visit. Feel free to contact us anytime with any questions, problems, or concerns.  It was a pleasure to see you again today! Congats on the new house!!   Websites that have reliable patient information: 1. American Academy of Asthma, Allergy, and Immunology: www.aaaai.org 2. Food Allergy Research and Education (FARE): foodallergy.org 3. Mothers of Asthmatics: http://www.asthmacommunitynetwork.org 4. American College of Allergy, Asthma, and Immunology: www.acaai.org   COVID-19 Vaccine Information can be found at: [place] For questions related to vaccine distribution or appointments, please email [email or web address] or call [phone].     "Like" [email or web address] on Facebook and Instagram for our latest updates!       Make sure you are registered to vote! If you have moved or changed any of your contact information, you will need to get this updated before voting!  In some cases, you MAY be able to register to vote online:  [phone]

## 2022-10-23 ENCOUNTER — Telehealth: Payer: Self-pay | Admitting: Allergy & Immunology

## 2022-10-23 ENCOUNTER — Other Ambulatory Visit: Payer: Self-pay

## 2022-10-23 ENCOUNTER — Encounter: Payer: Self-pay | Admitting: Allergy & Immunology

## 2022-10-23 ENCOUNTER — Ambulatory Visit (INDEPENDENT_AMBULATORY_CARE_PROVIDER_SITE_OTHER): Payer: Self-pay | Admitting: Allergy & Immunology

## 2022-10-23 DIAGNOSIS — J302 Other seasonal allergic rhinitis: Secondary | ICD-10-CM

## 2022-10-23 DIAGNOSIS — J3089 Other allergic rhinitis: Secondary | ICD-10-CM

## 2022-10-23 DIAGNOSIS — J0100 Acute maxillary sinusitis, unspecified: Secondary | ICD-10-CM

## 2022-10-23 DIAGNOSIS — T63481D Toxic effect of venom of other arthropod, accidental (unintentional), subsequent encounter: Secondary | ICD-10-CM

## 2022-10-23 MED ORDER — PREDNISONE 10 MG PO TABS
ORAL_TABLET | ORAL | 0 refills | Status: DC
Start: 2022-10-23 — End: 2024-03-09

## 2022-10-23 MED ORDER — AMOXICILLIN-POT CLAVULANATE 875-125 MG PO TABS: 1.0000 | ORAL_TABLET | Freq: Two times a day (BID) | ORAL | 0 refills | Status: AC

## 2022-10-23 NOTE — Telephone Encounter (Signed)
Patient states she is having a flare up. Patient feels pain & pressure in her cheeks and forehead. Patient is also coughing up thick and green stuff. Symptoms started last Thursday and were bad on Sunday & Monday. Patient thought she was feeling better but states it took a turn for the worse overnight. Patient has been using her montelukast, cetirizine, flonase and azelastine. Patient has also been using Sinex severe.   Patient would like to know if something else can be sent in for her.   Please advise  Best contact number: [phone]  [facility] - [address] [place], [place] [address]

## 2022-10-23 NOTE — Telephone Encounter (Signed)
Patient has been scheduled to speak with Dr. Dellis Anes today.

## 2022-10-23 NOTE — Progress Notes (Signed)
RE: Alexa Cohen MRN: [id number] DOB: [birth year] Date of Telemedicine Visit: 10/23/2022  Referring provider: Estanislado Pandy, MD Primary care provider: Estanislado Pandy, MD  Chief Complaint: Nasal Congestion, Sinusitis, and Cough   Telemedicine Follow Up Visit via Telephone: I connected with Alexa Cohen for a follow up on 10/24/22 by telephone and verified that I am speaking with the correct person using two identifiers.   I discussed the limitations, risks, security and privacy concerns of performing an evaluation and management service by telephone and the availability of in person appointments. I also discussed with the patient that there may be a patient responsible charge related to this service. The patient expressed understanding and agreed to proceed.  Patient is at home.  Provider is at the office.  Visit start time: 4:30 PM Visit end time: 4:52 PM Insurance consent/check in by: Albin Felling Medical consent and medical assistant/nurse: Ashleigh  History of Present Illness:  She is a 56 y.o. female, who is being followed for perennial and seasonal allergic rhinitis as well as a stinging insect allergy. Her previous allergy office visit was in November 2023 with myself.  We last saw her in November 2023.  At that time, she was doing very well with Flonase and Zyrtec as well as Singulair and Astelin.  We recommended using the nose sprays consistently to the holiday season to avoid antibiotics.  For stinging insect allergy, we refilled her EpiPen.  She started having issues one week ago. It started with congestion and then started getting better. It took a turn overnight and she can feel it in her forehead and her cheeks. This is typically a bad time of the year. It is her Christmas crud. She has been using her nose sprays - Flonase in the morning and Astelin at night. She has been doing the cetirizine in the morning and Singulair at night.  There are no other sick contacts. She has a  couple of nights that she was up late. She thinks that she was not well rested and then she dealt with everything. She was around a lot of artificial trees which made things worse. Everything kept getting worse.   She is hosting her son and daughter and spouses. She is going to have a small gathering but they are very excited about it.  Her son and daughter-in-law are moving to [place] in January.  This will be shortly after Alexa Cohen and her husband move into their new house.  Otherwise, there have been no changes to her past medical history, surgical history, family history, or social history.  Assessment and Plan:  Alexa Cohen is a 56 y.o. female with:  Seasonal and perennial allergic rhinitis (ragweed, weeds, trees, indoor molds, outdoor molds, dog and cockroach)   Insect sting allergy  Acute sinusitis - typically 0-1 antibiotic courses per year   Alexa Cohen with the [facility]   Alexa Cohen is doing very well with her regimen.  She did use her nasal sprays aggressively and has been using over-the-counter treatments including Mucinex, however, it has not quite kicked the sinus infection.  We are going to send in a course of Augmentin to use as a backup, but she prefers to try a prednisone burst initially to see if this helps.  Diagnostics: None.  Medication List:  Current Outpatient Medications  Medication Sig Dispense Refill   amoxicillin-clavulanate (AUGMENTIN) 875-125 MG tablet Take 1 tablet by mouth 2 (two) times daily for 10 days. 20 tablet 0   Azelastine HCl  137 MCG/SPRAY SOLN Place 2 sprays into the nose 2 (two) times daily as needed. 90 mL 3   buPROPion (WELLBUTRIN XL) 300 MG 24 hr tablet Take 1 tablet (300 mg total) by mouth every morning. 90 tablet 1   cetirizine (ZYRTEC) 10 MG tablet Take 1 tablet (10 mg total) by mouth daily. 90 tablet 3   cholecalciferol (VITAMIN D3) 25 MCG (1000 UNIT) tablet Take 1,000 Units by mouth daily.     citalopram (CELEXA) 20 MG tablet Take 1.5  tablets (30 mg total) by mouth daily. 135 tablet 1   guaiFENesin (MUCINEX) 600 MG 12 hr tablet Take 1,200 mg by mouth 2 (two) times daily as needed.     ibuprofen (ADVIL) 200 MG tablet Take 200 mg by mouth every 6 (six) hours as needed.     LORazepam (ATIVAN) 0.5 MG tablet Take 1 tablet (0.5 mg total) by mouth 3 (three) times daily as needed for anxiety. 30 tablet 0   MAGNESIUM PO Take by mouth.     montelukast (SINGULAIR) 10 MG tablet Take 1 tablet (10 mg total) by mouth at bedtime. 90 tablet 3   Multiple Vitamins-Minerals (IPRIFLAVONE OSTEO FORMULA PO) Take by mouth.     Multiple Vitamins-Minerals (ZINC PO) Take by mouth.     Omega-3 Fatty Acids (FISH OIL PO) Take by mouth.     predniSONE (DELTASONE) 10 MG tablet Take 3 tabs (30mg ) twice daily for 3 days, then 2 tabs (20mg ) twice daily for 3 days, then 1 tab (10mg ) twice daily for 3 days, then STOP. 36 tablet 0   fluticasone (FLONASE) 50 MCG/ACT nasal spray Place 1 spray into both nostrils daily as needed for allergies or rhinitis. 48 mL 3   No current facility-administered medications for this visit.   Allergies: No Known Allergies I reviewed her past medical history, social history, family history, and environmental history and no significant changes have been reported from previous visits.  Review of Systems  Constitutional: Negative.  Negative for fever.  HENT:  Positive for postnasal drip, sinus pressure and sinus pain. Negative for congestion, ear discharge and ear pain.   Eyes:  Negative for pain, discharge and redness.  Respiratory:  Positive for cough. Negative for shortness of breath and wheezing.   Cardiovascular: Negative.  Negative for chest pain and palpitations.  Gastrointestinal:  Negative for abdominal pain.  Skin: Negative.  Negative for rash.  Allergic/Immunologic: Positive for environmental allergies.  Neurological:  Negative for dizziness and headaches.  Hematological:  Does not bruise/bleed easily.     Objective:  Physical exam not obtained as encounter was done via telephone.   Previous notes and tests were reviewed.  I discussed the assessment and treatment plan with the patient. The patient was provided an opportunity to ask questions and all were answered. The patient agreed with the plan and demonstrated an understanding of the instructions.   The patient was advised to call back or seek an in-person evaluation if the symptoms worsen or if the condition fails to improve as anticipated.  I provided 22 minutes of non-face-to-face time during this encounter.  It was my pleasure to participate in Alexa Cohen's care today. Please feel free to contact me with any questions or concerns.   Sincerely,  , MD

## 2022-10-23 NOTE — Telephone Encounter (Signed)
Why was she just not added as a televisit to Anne's schedule or Chrissie's schedule?  Can someone add her as a televisit to my appointment?   Malachi Bonds, MD [facility]

## 2022-10-23 NOTE — Telephone Encounter (Signed)
Patient called back to see when someone was going to her , she sound bad. [phone]

## 2022-10-24 ENCOUNTER — Encounter: Payer: Self-pay | Admitting: Allergy & Immunology

## 2022-12-11 ENCOUNTER — Other Ambulatory Visit: Payer: No Typology Code available for payment source

## 2022-12-17 ENCOUNTER — Other Ambulatory Visit: Payer: Self-pay | Admitting: Obstetrics and Gynecology

## 2022-12-17 ENCOUNTER — Ambulatory Visit
Admission: RE | Admit: 2022-12-17 | Discharge: 2022-12-17 | Disposition: A | Discharge status: Home / Self Care | Payer: No Typology Code available for payment source | Source: Ambulatory Visit | Attending: Obstetrics and Gynecology | Admitting: Obstetrics and Gynecology

## 2022-12-17 DIAGNOSIS — N6320 Unspecified lump in the left breast, unspecified quadrant: Secondary | ICD-10-CM

## 2022-12-17 DIAGNOSIS — N6310 Unspecified lump in the right breast, unspecified quadrant: Secondary | ICD-10-CM

## 2023-01-15 ENCOUNTER — Other Ambulatory Visit: Payer: Self-pay | Admitting: Physician Assistant

## 2023-01-18 NOTE — Telephone Encounter (Signed)
Has appt with Helene Kelp 3/14.

## 2023-01-22 ENCOUNTER — Telehealth (INDEPENDENT_AMBULATORY_CARE_PROVIDER_SITE_OTHER): Payer: Self-pay | Admitting: Physician Assistant

## 2023-01-22 ENCOUNTER — Encounter: Payer: Self-pay | Admitting: Physician Assistant

## 2023-01-22 DIAGNOSIS — F329 Major depressive disorder, single episode, unspecified: Secondary | ICD-10-CM

## 2023-01-22 DIAGNOSIS — F411 Generalized anxiety disorder: Secondary | ICD-10-CM

## 2023-01-22 MED ORDER — CITALOPRAM HYDROBROMIDE 20 MG PO TABS: 30.0000 mg | ORAL_TABLET | Freq: Every day | ORAL | 1 refills | Status: DC

## 2023-01-22 MED ORDER — BUPROPION HCL ER (XL) 300 MG PO TB24: 300.0000 mg | ORAL_TABLET | Freq: Every morning | ORAL | 1 refills | Status: DC

## 2023-01-22 NOTE — Progress Notes (Addendum)
[facility] Med Check  Patient ID: Alexa Cohen,  MRN: [id number]  PCP: Manon Hilding, MD  Date of Evaluation: 01/22/2023 Time spent:20 minutes  Chief Complaint:  Chief Complaint   Anxiety; Depression; Follow-up    Virtual Visit via Telehealth  I connected with patient by a video enabled telemedicine application with their informed consent, and verified patient privacy and that I am speaking with the correct person using two identifiers.  I am private, in my office and the patient is at home.  I discussed the limitations, risks, security and privacy concerns of performing an evaluation and management service by video and the availability of in person appointments. I also discussed with the patient that there may be a patient responsible charge related to this service. The patient expressed understanding and agreed to proceed.   I discussed the assessment and treatment plan with the patient. The patient was provided an opportunity to ask questions and all were answered. The patient agreed with the plan and demonstrated an understanding of the instructions.   The patient was advised to call back or seek an in-person evaluation if the symptoms worsen or if the condition fails to improve as anticipated.  I provided 20  minutes of non-face-to-face time during this encounter.  HISTORY/CURRENT STATUS: HPI for routine med check.  Doing well.  Patient is able to enjoy things.  Energy and motivation are good.  Work is going well.  She is very busy right now with tax season.  No extreme sadness, tearfulness, or feelings of hopelessness.  Sleeps well most of the time. ADLs and personal hygiene are normal.   Denies any changes in concentration, making decisions, or remembering things.  Appetite has not changed.  Weight is stable.  She does get anxious at times, takes the Ativan as needed.  It is very rare though, she is still using a bottle that was filled in August 2022.  Not having panic  attacks but does get overwhelmed sometimes.  Denies suicidal or homicidal thoughts.  Patient denies increased energy with decreased need for sleep, increased talkativeness, racing thoughts, impulsivity or risky behaviors, increased spending, increased libido, grandiosity, increased irritability or anger, paranoia, or hallucinations.  Denies dizziness, syncope, seizures, numbness, tingling, tremor, tics, unsteady gait, slurred speech, confusion. Denies muscle or joint pain, stiffness, or dystonia.  Individual Medical History/ Review of Systems: Changes? :No    Past medications for mental health diagnoses include: Celexa, Wellbutrin XL, Ativan  Allergies: Patient has no known allergies.  Current Medications:  Current Outpatient Medications:    Azelastine HCl 137 MCG/SPRAY SOLN, Place 2 sprays into the nose 2 (two) times daily as needed., Disp: 90 mL, Rfl: 3   cholecalciferol (VITAMIN D3) 25 MCG (1000 UNIT) tablet, Take 1,000 Units by mouth daily., Disp: , Rfl:    guaiFENesin (MUCINEX) 600 MG 12 hr tablet, Take 1,200 mg by mouth 2 (two) times daily as needed., Disp: , Rfl:    ibuprofen (ADVIL) 200 MG tablet, Take 200 mg by mouth every 6 (six) hours as needed., Disp: , Rfl:    LORazepam (ATIVAN) 0.5 MG tablet, Take 1 tablet (0.5 mg total) by mouth 3 (three) times daily as needed for anxiety., Disp: 30 tablet, Rfl: 0   MAGNESIUM PO, Take by mouth., Disp: , Rfl:    Omega-3 Fatty Acids (FISH OIL PO), Take by mouth., Disp: , Rfl:    buPROPion (WELLBUTRIN XL) 300 MG 24 hr tablet, Take 1 tablet (300 mg total) by mouth every morning.,  Disp: 90 tablet, Rfl: 1   cetirizine (ZYRTEC) 10 MG tablet, Take 1 tablet (10 mg total) by mouth daily., Disp: 90 tablet, Rfl: 3   citalopram (CELEXA) 20 MG tablet, Take 1.5 tablets (30 mg total) by mouth daily., Disp: 135 tablet, Rfl: 1   fluticasone (FLONASE) 50 MCG/ACT nasal spray, Place 1 spray into both nostrils daily as needed for allergies or rhinitis., Disp: 48  mL, Rfl: 3   montelukast (SINGULAIR) 10 MG tablet, Take 1 tablet (10 mg total) by mouth at bedtime., Disp: 90 tablet, Rfl: 3   Multiple Vitamins-Minerals (IPRIFLAVONE OSTEO FORMULA PO), Take by mouth. (Patient not taking: Reported on 01/22/2023), Disp: , Rfl:    Multiple Vitamins-Minerals (ZINC PO), Take by mouth. (Patient not taking: Reported on 01/22/2023), Disp: , Rfl:    predniSONE (DELTASONE) 10 MG tablet, Take 3 tabs (30mg ) twice daily for 3 days, then 2 tabs (20mg ) twice daily for 3 days, then 1 tab (10mg ) twice daily for 3 days, then STOP. (Patient not taking: Reported on 01/22/2023), Disp: 36 tablet, Rfl: 0 Medication Side Effects: none  Family Medical/ Social History: Changes?  Has now moved to her home [place]:  Last menstrual period 10/06/2016.There is no height or weight on file to calculate BMI.  General Appearance: Casual, Neat and Well Groomed  Eye Contact:  Good  Speech:  Clear and Coherent and Normal Rate  Volume:  Normal  Mood:  Euthymic  Affect:  Appropriate  Thought Process:  Goal Directed and Descriptions of Associations: Circumstantial  Orientation:  Full (Time, Place, and Person)  Thought Content: Logical   Suicidal Thoughts:  No  Homicidal Thoughts:  No  Memory:  WNL  Judgement:  Good  Insight:  Good  Psychomotor Activity:  Normal  Concentration:  Concentration: Good and Attention Span: Good  Recall:  Good  Fund of Knowledge: Good  Language: Good  Assets:  Desire for Improvement Financial Resources/Insurance Housing Resilience Transportation Vocational/Educational  ADL's:  Intact  Cognition: WNL  Prognosis:  Good   DIAGNOSES:    ICD-10-CM   1. Generalized anxiety disorder  F41.1     2. 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  RECOMMENDATIONS:  PDMP was reviewed.  Last Ativan filled 07/03/2021.  I provided 20 minutes of non-face-to-face time during this encounter,  including time spent before and after the visit in records review, medical decision making, counseling pertinent to today's visit, and charting.   She is doing well so no changes need to be made.  Continue Wellbutrin XL 300 mg 1 every morning. Continue Celexa 30 mg qd.  Continue Ativan 0.5 mg, 1 p.o. 3 times daily as needed. Return in 6 months.  Donnal Moat, PA-C

## 2023-05-29 ENCOUNTER — Ambulatory Visit
Admission: RE | Admit: 2023-05-29 | Discharge: 2023-05-29 | Disposition: A | Discharge status: Home / Self Care | Payer: No Typology Code available for payment source | Source: Ambulatory Visit | Attending: Obstetrics and Gynecology | Admitting: Obstetrics and Gynecology

## 2023-05-29 DIAGNOSIS — N6320 Unspecified lump in the left breast, unspecified quadrant: Secondary | ICD-10-CM

## 2023-11-16 ENCOUNTER — Encounter: Payer: Self-pay | Admitting: Physician Assistant

## 2023-11-16 ENCOUNTER — Telehealth: Payer: Self-pay | Admitting: Physician Assistant

## 2023-11-16 DIAGNOSIS — F329 Major depressive disorder, single episode, unspecified: Secondary | ICD-10-CM

## 2023-11-16 DIAGNOSIS — F411 Generalized anxiety disorder: Secondary | ICD-10-CM

## 2023-11-16 NOTE — Progress Notes (Signed)
 [facility]  Patient ID: Alexa Cohen,  MRN: [id number]  PCP: Atilano Deward ORN, MD  Date of Evaluation: 11/16/2023 Time spent:20 minutes  Chief Complaint:  Chief Complaint   Anxiety; Depression; Follow-up    Virtual Visit via Telehealth  I connected with patient by a video enabled telemedicine application with their informed consent, and verified patient privacy and that I am speaking with the correct person using two identifiers.  I am private, in my office and the patient is at home.  I discussed the limitations, risks, security and privacy concerns of performing an evaluation and management service by video and the availability of in person appointments. I also discussed with the patient that there may be a patient responsible charge related to this service. The patient expressed understanding and agreed to proceed.   I discussed the assessment and treatment plan with the patient. The patient was provided an opportunity to ask questions and all were answered. The patient agreed with the plan and demonstrated an understanding of the instructions.   The patient was advised to call back or seek an in-person evaluation if the symptoms worsen or if the condition fails to improve as anticipated.  I provided 20  minutes of non-face-to-face time during this encounter.  HISTORY/CURRENT STATUS: HPI for routine med check.  Has osteoporosis, she's added a lot of supplements, and will see [facility] next month. She's read that Celexa , Prozac, and Zoloft can cause it. She decreased the Celexa  to 20 mg and had palptitations to the point that she had to go to the ER.  She was told everything is normal, she was not referred to cardiology and no changes in meds were made.  She decided to increase the Celexa  back to 30 mg and has had no palpitations.  She wonders if there is anything else that she can take instead of Celexa  that would not put her at increased risk for  osteoporosis.  Her mom had osteoporosis as well.  Patient is able to enjoy things.  Energy and motivation are good.   No extreme sadness, tearfulness, or feelings of hopelessness.  Sleeps well most of the time. ADLs and personal hygiene are normal.   Denies any changes in concentration, making decisions, or remembering things.  Appetite has not changed.  Weight is stable.  Anxiety is controlled.  She does take the Ativan  as needed.  Tries not to though.  Denies suicidal or homicidal thoughts.  Patient denies increased energy with decreased need for sleep, increased talkativeness, racing thoughts, impulsivity or risky behaviors, increased spending, increased libido, grandiosity, increased irritability or anger, paranoia, or hallucinations.  Denies dizziness, syncope, seizures, numbness, tingling, tremor, tics, unsteady gait, slurred speech, confusion. Denies muscle or joint pain, stiffness, or dystonia.  Individual Medical History/ Review of Systems: Changes? :Yes  has worsening of osteoporosis   Past medications for mental health diagnoses include: Celexa , Wellbutrin  XL, Ativan   Allergies: Patient has no known allergies.  Current Medications:  Current Outpatient Medications:    Ascorbic Acid (VITAMIN C PO), Take by mouth., Disp: , Rfl:    Azelastine  HCl 137 MCG/SPRAY SOLN, Place 2 sprays into the nose 2 (two) times daily as needed., Disp: 90 mL, Rfl: 3   buPROPion  (WELLBUTRIN  XL) 300 MG 24 hr tablet, Take 1 tablet (300 mg total) by mouth every morning., Disp: 90 tablet, Rfl: 1   CALCIUM ACETATE-MAGNESIUM CARB PO, Take by mouth., Disp: , Rfl:    cholecalciferol (VITAMIN D3) 25 MCG (1000  UNIT) tablet, Take 1,000 Units by mouth daily., Disp: , Rfl:    citalopram  (CELEXA ) 20 MG tablet, Take 1.5 tablets (30 mg total) by mouth daily., Disp: 135 tablet, Rfl: 1   guaiFENesin (MUCINEX) 600 MG 12 hr tablet, Take 1,200 mg by mouth 2 (two) times daily as needed., Disp: , Rfl:    ibuprofen (ADVIL) 200 MG  tablet, Take 200 mg by mouth every 6 (six) hours as needed., Disp: , Rfl:    LORazepam  (ATIVAN ) 0.5 MG tablet, Take 1 tablet (0.5 mg total) by mouth 3 (three) times daily as needed for anxiety., Disp: 30 tablet, Rfl: 0   Magnesium Glycinate POWD, by Does not apply route., Disp: , Rfl:    MAGNESIUM PO, Take by mouth., Disp: , Rfl:    Omega-3 Fatty Acids (FISH OIL PO), Take by mouth., Disp: , Rfl:    Zinc Acetate, Oral, (ZINC ACETATE PO), Take by mouth., Disp: , Rfl:    cetirizine  (ZYRTEC ) 10 MG tablet, Take 1 tablet (10 mg total) by mouth daily., Disp: 90 tablet, Rfl: 3   fluticasone  (FLONASE ) 50 MCG/ACT nasal spray, Place 1 spray into both nostrils daily as needed for allergies or rhinitis., Disp: 48 mL, Rfl: 3   montelukast  (SINGULAIR ) 10 MG tablet, Take 1 tablet (10 mg total) by mouth at bedtime., Disp: 90 tablet, Rfl: 3   Multiple Vitamins-Minerals (IPRIFLAVONE OSTEO FORMULA PO), Take by mouth. (Patient not taking: Reported on 11/16/2023), Disp: , Rfl:    Multiple Vitamins-Minerals (ZINC PO), Take by mouth. (Patient not taking: Reported on 11/16/2023), Disp: , Rfl:    predniSONE  (DELTASONE ) 10 MG tablet, Take 3 tabs (30mg ) twice daily for 3 days, then 2 tabs (20mg ) twice daily for 3 days, then 1 tab (10mg ) twice daily for 3 days, then STOP. (Patient not taking: Reported on 01/22/2023), Disp: 36 tablet, Rfl: 0 Medication Side Effects: none  Family Medical/ Social History: Changes? None  MENTAL HEALTH EXAM:  Last menstrual period 10/06/2016.There is no height or weight on file to calculate BMI.  General Appearance: Casual, Neat and Well Groomed  Eye Contact:  Good  Speech:  Clear and Coherent and Normal Rate  Volume:  Normal  Mood:  Euthymic  Affect:  Appropriate  Thought Process:  Goal Directed and Descriptions of Associations: Circumstantial  Orientation:  Full (Time, Place, and Person)  Thought Content: Logical   Suicidal Thoughts:  No  Homicidal Thoughts:  No  Memory:  WNL  Judgement:   Good  Insight:  Good  Psychomotor Activity:  Normal  Concentration:  Concentration: Good and Attention Span: Good  Recall:  Good  Fund of Knowledge: Good  Language: Good  Assets:  Communication Skills Desire for Improvement Financial Resources/Insurance Housing Resilience Transportation Vocational/Educational  ADL's:  Intact  Cognition: WNL  Prognosis:  Good   DIAGNOSES:    ICD-10-CM   1. Generalized anxiety disorder  F41.1     2. 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  RECOMMENDATIONS:  PDMP was reviewed.  No recent controlled substances. I provided  20 minutes of non-face to face time during this encounter, including time spent before and after the visit in records review, medical decision making, counseling pertinent to today's visit, and charting.   We discussed the osteoporosis.  I will do more research, it is not common that I am aware of for any SSRIs to lead to osteoporosis.  We agreed to make no  changes in her medications until she sees the provider at [facility] and her PCP who is a DO in approximately 6 weeks.  I am hesitant to make any changes right now because it is wintertime and that can be hard for her with mood.  As for the palpitations it is hard to say if it was caused by the Celexa  reduction or just the fact that the anxiety worsened with the lower dose of Celexa .  At any rate if we do decide to wean off that I would go down by 5 mg at a time and depending on what we choose to do instead, we would cross taper.  Continue Wellbutrin  XL 300 mg 1 every morning. Continue Celexa  30 mg qd.  Continue Ativan  0.5 mg, 1 p.o. 3 times daily as needed. Return in 2 months.  Verneita Cooks, PA-C

## 2024-01-14 ENCOUNTER — Telehealth: Payer: Self-pay | Admitting: Physician Assistant

## 2024-01-14 NOTE — Telephone Encounter (Signed)
 Noted.

## 2024-01-14 NOTE — Telephone Encounter (Signed)
 Pt called at 8:03 am to report 3/7 apt not needed. No med change needed. RS to 6/12 Please advise provider of update per pt. Contact # [phone]

## 2024-01-14 NOTE — Telephone Encounter (Signed)
 Just FYI - see msg.

## 2024-01-15 ENCOUNTER — Ambulatory Visit: Payer: Self-pay | Admitting: Physician Assistant

## 2024-03-09 ENCOUNTER — Other Ambulatory Visit: Payer: Self-pay

## 2024-03-09 ENCOUNTER — Encounter: Payer: Self-pay | Admitting: Allergy & Immunology

## 2024-03-09 ENCOUNTER — Ambulatory Visit: Payer: Self-pay | Admitting: Allergy & Immunology

## 2024-03-09 VITALS — BP 124/82 | HR 86 | Temp 98.0°F | Resp 18 | Ht 62.0 in | Wt 152.4 lb

## 2024-03-09 DIAGNOSIS — T63481D Toxic effect of venom of other arthropod, accidental (unintentional), subsequent encounter: Secondary | ICD-10-CM

## 2024-03-09 DIAGNOSIS — J3089 Other allergic rhinitis: Secondary | ICD-10-CM

## 2024-03-09 DIAGNOSIS — J302 Other seasonal allergic rhinitis: Secondary | ICD-10-CM

## 2024-03-09 DIAGNOSIS — J309 Allergic rhinitis, unspecified: Secondary | ICD-10-CM

## 2024-03-09 MED ORDER — AZELASTINE HCL 137 MCG/SPRAY NA SOLN: 2.0000 | Freq: Two times a day (BID) | NASAL | 3 refills | Status: AC | PRN

## 2024-03-09 MED ORDER — CETIRIZINE HCL 10 MG PO TABS: 10.0000 mg | ORAL_TABLET | Freq: Every day | ORAL | 3 refills | Status: AC

## 2024-03-09 MED ORDER — PREDNISONE 10 MG PO TABS: ORAL_TABLET | ORAL | 0 refills | Status: AC

## 2024-03-09 MED ORDER — MONTELUKAST SODIUM 10 MG PO TABS: 10.0000 mg | ORAL_TABLET | Freq: Every day | ORAL | 3 refills | Status: AC
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
## 2024-03-09 NOTE — Patient Instructions (Addendum)
 1. Seasonal and perennial allergic rhinitis (ragweed, weeds, trees, indoor molds, outdoor molds, dog and cockroach) - We are going to continue with the montelukast  and cetirizine  and Flonase .  - We are going to start you on a prednisone  taper to see if this knocks it out. - Call us  if this does not do the trick and we can send in antibiotics.  - Continue with: Flonase  (fluticasone ) two sprays per nostril daily and Zyrtec  (cetirizine ) 10mg  tablet in the morning combined with Singulair  (montelukast ) 10mg  daily and Astelin  (azelastine ) 2 sprays per nostril at night.  - Try using the nose sprays consistently through the holiday season to avoid needing any antibiotics.   2. Stinging insect allergy  - Let us  know if you need a new EpiPen . - Continue to try to avoid stinging insects.  3. Return in about 1 year (around 03/09/2025).   Please inform us  of any Emergency Department visits, hospitalizations, or changes in symptoms. Call us  before going to the ED for breathing or allergy  symptoms since we might be able to fit you in for a sick visit. Feel free to contact us  anytime with any questions, problems, or concerns.  It was a pleasure to see you again today!   Websites that have reliable patient information: 1. American Academy of Asthma, Allergy , and Immunology: www.aaaai.org 2. Food Allergy  Research and Education (FARE): foodallergy.org 3. Mothers of Asthmatics: http://www.asthmacommunitynetwork.org 4. [employer] of Allergy , Asthma, and Immunology: www.acaai.org   COVID-19 Vaccine Information can be found at: [email or web address].nl For questions related to vaccine distribution or appointments, please email [email or web address] or call [phone].     "Like" us  on Facebook and Instagram for our latest updates!       Make sure you are registered to vote! If you have moved or changed any of your contact information, you will  need to get this updated before voting!  In some cases, you MAY be able to register to vote online: [email or web address]

## 2024-04-21 ENCOUNTER — Encounter: Payer: Self-pay | Admitting: Physician Assistant

## 2024-04-21 ENCOUNTER — Ambulatory Visit: Payer: Self-pay | Admitting: Physician Assistant

## 2024-04-21 DIAGNOSIS — F411 Generalized anxiety disorder: Secondary | ICD-10-CM

## 2024-04-21 DIAGNOSIS — F329 Major depressive disorder, single episode, unspecified: Secondary | ICD-10-CM

## 2024-04-21 MED ORDER — BUPROPION HCL ER (XL) 300 MG PO TB24: 300.0000 mg | ORAL_TABLET | Freq: Every morning | ORAL | 1 refills | Status: DC

## 2024-04-21 MED ORDER — CITALOPRAM HYDROBROMIDE 20 MG PO TABS: 20.0000 mg | ORAL_TABLET | Freq: Every day | ORAL | 1 refills | Status: DC

## 2024-04-21 MED ORDER — LORAZEPAM 0.5 MG PO TABS: 0.5000 mg | ORAL_TABLET | Freq: Three times a day (TID) | ORAL | 0 refills | Status: AC | PRN

## 2024-04-21 NOTE — Progress Notes (Signed)
 [facility]  Patient ID: Alexa Cohen,  MRN: [id number]  PCP: Orest Bio, MD  Date of Evaluation: 04/21/2024 Time spent:20 minutes  Chief Complaint:  Chief Complaint   Anxiety; Depression; Follow-up    Virtual Visit via Telehealth  I connected with patient by a video enabled telemedicine application with their informed consent, and verified patient privacy and that I am speaking with the correct person using two identifiers.  I am private, in my office and the patient is at home.  I discussed the limitations, risks, security and privacy concerns of performing an evaluation and management service by video and the availability of in person appointments. I also discussed with the patient that there may be a patient responsible charge related to this service. The patient expressed understanding and agreed to proceed.   I discussed the assessment and treatment plan with the patient. The patient was provided an opportunity to ask questions and all were answered. The patient agreed with the plan and demonstrated an understanding of the instructions.   The patient was advised to call back or seek an in-person evaluation if the symptoms worsen or if the condition fails to improve as anticipated.  I provided 20  minutes of non-face-to-face time during this encounter.  HISTORY/CURRENT STATUS: HPI for routine med check.  Alexa Cohen is doing well. She decreased the Celexa  to 20 mg and is doing well on that dose.  She would really like to get off as many medications as she can at some point in the future but does not feel this is the right time.  Her mood is stable.  She is able to enjoy things.  She enjoys being around her grandson who is not quite a-year-old.  A granddaughter is expected in September of this year, she is very happy about that.  energy and motivation are good for her.  No extreme sadness, tearfulness, or feelings of hopelessness.  Sleeps well most of the time. ADLs nl,  hygiene nl.  Denies any changes in concentration, making decisions, or remembering things.  Appetite has not changed.  Weight is stable.  She does get anxious sometimes, usually situational.  Takes the Ativan  very rarely and it is effective when she needs it.  She has had the same prescription for several years now.  Denies suicidal or homicidal thoughts.  Patient denies increased energy with decreased need for sleep, increased talkativeness, racing thoughts, impulsivity or risky behaviors, increased spending, increased libido, grandiosity, increased irritability or anger, paranoia, or hallucinations.  Denies dizziness, syncope, seizures, numbness, tingling, tremor, tics, unsteady gait, slurred speech, confusion. Denies muscle or joint pain, stiffness, or dystonia.  Individual Medical History/ Review of Systems: Changes? :No    Past medications for mental health diagnoses include: Celexa , Wellbutrin  XL, Ativan   Allergies: Patient has no known allergies.  Current Medications:  Current Outpatient Medications:    Ascorbic Acid (VITAMIN C PO), Take by mouth., Disp: , Rfl:    Azelastine  HCl 137 MCG/SPRAY SOLN, Place 2 sprays into the nose 2 (two) times daily as needed., Disp: 90 mL, Rfl: 3   CALCIUM ACETATE-MAGNESIUM CARB PO, Take by mouth., Disp: , Rfl:    cetirizine  (ZYRTEC ) 10 MG tablet, Take 1 tablet (10 mg total) by mouth daily., Disp: 90 tablet, Rfl: 3   cholecalciferol (VITAMIN D3) 25 MCG (1000 UNIT) tablet, Take 1,000 Units by mouth daily., Disp: , Rfl:    estradiol (ESTRACE) 1 MG tablet, Take 1 mg by mouth daily., Disp: , Rfl:  guaiFENesin (MUCINEX) 600 MG 12 hr tablet, Take 1,200 mg by mouth 2 (two) times daily as needed., Disp: , Rfl:    ibuprofen (ADVIL) 200 MG tablet, Take 200 mg by mouth every 6 (six) hours as needed., Disp: , Rfl:    Magnesium Glycinate POWD, by Does not apply route., Disp: , Rfl:    MAGNESIUM PO, Take by mouth., Disp: , Rfl:    montelukast  (SINGULAIR ) 10 MG  tablet, Take 1 tablet (10 mg total) by mouth at bedtime., Disp: 90 tablet, Rfl: 3   Multiple Vitamins-Minerals (IPRIFLAVONE OSTEO FORMULA PO), Take by mouth., Disp: , Rfl:    Multiple Vitamins-Minerals (ZINC PO), Take by mouth., Disp: , Rfl:    Omega-3 Fatty Acids (FISH OIL PO), Take by mouth., Disp: , Rfl:    progesterone (PROMETRIUM) 100 MG capsule, Take 200 mg by mouth at bedtime., Disp: , Rfl:    Zinc Acetate, Oral, (ZINC ACETATE PO), Take by mouth., Disp: , Rfl:    buPROPion  (WELLBUTRIN  XL) 300 MG 24 hr tablet, Take 1 tablet (300 mg total) by mouth every morning., Disp: 90 tablet, Rfl: 1   citalopram  (CELEXA ) 20 MG tablet, Take 1 tablet (20 mg total) by mouth daily., Disp: 90 tablet, Rfl: 1   fluticasone  (FLONASE ) 50 MCG/ACT nasal spray, Place 1 spray into both nostrils daily as needed for allergies or rhinitis., Disp: 48 mL, Rfl: 3   LORazepam  (ATIVAN ) 0.5 MG tablet, Take 1 tablet (0.5 mg total) by mouth 3 (three) times daily as needed for anxiety., Disp: 30 tablet, Rfl: 0   predniSONE  (DELTASONE ) 10 MG tablet, Take 3 tabs (30mg ) twice daily for 3 days, then 2 tabs (20mg ) twice daily for 3 days, then 1 tab (10mg ) twice daily for 3 days, then STOP., Disp: 36 tablet, Rfl: 0 Medication Side Effects: none  Family Medical/ Social History: Changes? None  MENTAL HEALTH EXAM:  Last menstrual period 10/06/2016.There is no height or weight on file to calculate BMI.  General Appearance: Unable to assess  Eye Contact:  Unable to assess  Speech:  Clear and Coherent and Normal Rate  Volume:  Normal  Mood:  Euthymic  Affect:  Unable to assess  Thought Process:  Goal Directed and Descriptions of Associations: Circumstantial  Orientation:  Full (Time, Place, and Person)  Thought Content: Logical   Suicidal Thoughts:  No  Homicidal Thoughts:  No  Memory:  WNL  Judgement:  Good  Insight:  Good  Psychomotor Activity:  Unable to assess  Concentration:  Concentration: Good and Attention Span: Good   Recall:  Good  Fund of Knowledge: Good  Language: Good  Assets:  Communication Skills Desire for Improvement Financial Resources/Insurance Housing Resilience Transportation Vocational/Educational  ADL's:  Intact  Cognition: WNL  Prognosis:  Good   DIAGNOSES:    ICD-10-CM   1. Generalized anxiety disorder  F41.1     2. 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 in the past.  RECOMMENDATIONS:  PDMP was reviewed.  Testosterone powder prescribed 03/07/2024. I provided approximately 20 minutes of non-face-to-face time during this encounter, including time spent before and after the visit in records review, medical decision making, counseling pertinent to today's visit, and charting.   We discussed the mental health medications.  Since she is doing well, we agreed to make no changes.  However if she would like to try decreasing the Celexa  then we can do that prior to her next visit.  We would go to 15 mg daily and recheck in 6 to 8 weeks after that change is made.  Continue Wellbutrin  XL 300 mg 1 every morning. Continue Celexa  20 mg qd.  Continue Ativan  0.5 mg, 1 p.o. 3 times daily as needed. Continue vitamins and supplements as per med list. Return in 6 months.  Marvia Slocumb, PA-C

## 2024-04-22 ENCOUNTER — Other Ambulatory Visit: Payer: Self-pay | Admitting: Obstetrics and Gynecology

## 2024-04-22 DIAGNOSIS — N6320 Unspecified lump in the left breast, unspecified quadrant: Secondary | ICD-10-CM

## 2024-04-22 DIAGNOSIS — Z09 Encounter for follow-up examination after completed treatment for conditions other than malignant neoplasm: Secondary | ICD-10-CM

## 2024-05-30 ENCOUNTER — Ambulatory Visit
Admission: RE | Admit: 2024-05-30 | Discharge: 2024-05-30 | Disposition: A | Discharge status: Home / Self Care | Payer: Self-pay | Source: Ambulatory Visit | Attending: Obstetrics and Gynecology | Admitting: Obstetrics and Gynecology

## 2024-05-30 DIAGNOSIS — Z09 Encounter for follow-up examination after completed treatment for conditions other than malignant neoplasm: Secondary | ICD-10-CM

## 2024-05-30 DIAGNOSIS — N6320 Unspecified lump in the left breast, unspecified quadrant: Secondary | ICD-10-CM

## 2024-10-13 ENCOUNTER — Other Ambulatory Visit: Payer: Self-pay | Admitting: Physician Assistant

## 2024-10-14 NOTE — Telephone Encounter (Signed)
 Please call to schedule FU, due this month.

## 2024-10-14 NOTE — Telephone Encounter (Signed)
 Lvm for patient to call and schedule

## 2025-03-10 ENCOUNTER — Ambulatory Visit: Payer: Self-pay | Admitting: Allergy & Immunology
